# Patient Record
Sex: Male | Born: 1935 | Race: Black or African American | Hispanic: No | Marital: Single | Smoking: Former smoker
Health system: Southern US, Community
[De-identification: ages and names within clinical notes are randomized; demographics above are authoritative.]

## PROBLEM LIST (undated history)

## (undated) DIAGNOSIS — K219 Gastro-esophageal reflux disease without esophagitis: Secondary | ICD-10-CM

## (undated) DIAGNOSIS — I10 Essential (primary) hypertension: Secondary | ICD-10-CM

## (undated) DIAGNOSIS — E119 Type 2 diabetes mellitus without complications: Secondary | ICD-10-CM

## (undated) DIAGNOSIS — M1990 Unspecified osteoarthritis, unspecified site: Secondary | ICD-10-CM

## (undated) DIAGNOSIS — E785 Hyperlipidemia, unspecified: Secondary | ICD-10-CM

## (undated) DIAGNOSIS — I723 Aneurysm of iliac artery: Secondary | ICD-10-CM

## (undated) HISTORY — PX: CHOLECYSTECTOMY: SHX55

## (undated) HISTORY — DX: Aneurysm of iliac artery: I72.3

## (undated) HISTORY — PX: TUMOR EXCISION: SHX421

---

## 2013-03-21 ENCOUNTER — Ambulatory Visit: Payer: Self-pay | Admitting: Podiatry

## 2013-03-24 ENCOUNTER — Ambulatory Visit (INDEPENDENT_AMBULATORY_CARE_PROVIDER_SITE_OTHER): Payer: Medicare Other | Admitting: Podiatry

## 2013-03-24 VITALS — BP 161/86 | Ht 71.0 in | Wt 223.0 lb

## 2013-03-24 DIAGNOSIS — B351 Tinea unguium: Secondary | ICD-10-CM | POA: Insufficient documentation

## 2013-03-24 DIAGNOSIS — M25579 Pain in unspecified ankle and joints of unspecified foot: Secondary | ICD-10-CM

## 2013-03-24 NOTE — Progress Notes (Signed)
Subjective: 79 y.o. year old male patient presents complaining of painful nails. Patient requests toe nails, corns and calluses trimmed. No new problems.   Patient Summary List & History reviewed for allergies, medications, medical problems and surgical history.  Objective: Dermatologic:  Thick dystrophic nails x 10.  Vascular: All pedal pulses palpable. No edema or erythema on foot.   Orthopedic:  Rectus foot without gross deformities.  Neurologic:  All epicritic and tactile sensations grossly intact.  Assessment: Dystrophic mycotic nails x 10.  Treatment: All mycotic nails debrided.  Patient will return as needed.

## 2013-10-27 ENCOUNTER — Emergency Department (HOSPITAL_BASED_OUTPATIENT_CLINIC_OR_DEPARTMENT_OTHER): Payer: Medicare Other

## 2013-10-27 ENCOUNTER — Encounter (HOSPITAL_BASED_OUTPATIENT_CLINIC_OR_DEPARTMENT_OTHER): Payer: Self-pay | Admitting: Emergency Medicine

## 2013-10-27 ENCOUNTER — Emergency Department (HOSPITAL_BASED_OUTPATIENT_CLINIC_OR_DEPARTMENT_OTHER)
Admission: EM | Admit: 2013-10-27 | Discharge: 2013-10-27 | Disposition: A | Discharge status: Home / Self Care | Payer: Medicare Other | Attending: Emergency Medicine | Admitting: Emergency Medicine

## 2013-10-27 DIAGNOSIS — N281 Cyst of kidney, acquired: Secondary | ICD-10-CM | POA: Insufficient documentation

## 2013-10-27 DIAGNOSIS — N400 Enlarged prostate without lower urinary tract symptoms: Secondary | ICD-10-CM | POA: Insufficient documentation

## 2013-10-27 DIAGNOSIS — E785 Hyperlipidemia, unspecified: Secondary | ICD-10-CM | POA: Insufficient documentation

## 2013-10-27 DIAGNOSIS — I723 Aneurysm of iliac artery: Secondary | ICD-10-CM | POA: Insufficient documentation

## 2013-10-27 DIAGNOSIS — Z7982 Long term (current) use of aspirin: Secondary | ICD-10-CM | POA: Insufficient documentation

## 2013-10-27 DIAGNOSIS — Z79899 Other long term (current) drug therapy: Secondary | ICD-10-CM | POA: Insufficient documentation

## 2013-10-27 DIAGNOSIS — R1110 Vomiting, unspecified: Secondary | ICD-10-CM

## 2013-10-27 DIAGNOSIS — I10 Essential (primary) hypertension: Secondary | ICD-10-CM | POA: Insufficient documentation

## 2013-10-27 DIAGNOSIS — M129 Arthropathy, unspecified: Secondary | ICD-10-CM | POA: Insufficient documentation

## 2013-10-27 DIAGNOSIS — E119 Type 2 diabetes mellitus without complications: Secondary | ICD-10-CM | POA: Insufficient documentation

## 2013-10-27 DIAGNOSIS — Z87891 Personal history of nicotine dependence: Secondary | ICD-10-CM | POA: Insufficient documentation

## 2013-10-27 DIAGNOSIS — K219 Gastro-esophageal reflux disease without esophagitis: Secondary | ICD-10-CM | POA: Insufficient documentation

## 2013-10-27 HISTORY — DX: Hyperlipidemia, unspecified: E78.5

## 2013-10-27 HISTORY — DX: Gastro-esophageal reflux disease without esophagitis: K21.9

## 2013-10-27 HISTORY — DX: Type 2 diabetes mellitus without complications: E11.9

## 2013-10-27 HISTORY — DX: Unspecified osteoarthritis, unspecified site: M19.90

## 2013-10-27 HISTORY — DX: Essential (primary) hypertension: I10

## 2013-10-27 LAB — URINALYSIS, ROUTINE W REFLEX MICROSCOPIC
Glucose, UA: NEGATIVE mg/dL
Hgb urine dipstick: NEGATIVE
Protein, ur: 100 mg/dL — AB
Specific Gravity, Urine: 1.021 (ref 1.005–1.030)
Urobilinogen, UA: 2 mg/dL — ABNORMAL HIGH (ref 0.0–1.0)

## 2013-10-27 LAB — COMPREHENSIVE METABOLIC PANEL
AST: 219 U/L — ABNORMAL HIGH (ref 0–37)
CO2: 24 mEq/L (ref 19–32)
Calcium: 10.7 mg/dL — ABNORMAL HIGH (ref 8.4–10.5)
Creatinine, Ser: 0.9 mg/dL (ref 0.50–1.35)
GFR calc Af Amer: >90 mL/min (ref >90)
GFR calc non Af Amer: 79 mL/min — ABNORMAL LOW (ref >90)
Total Protein: 8 g/dL (ref 6.0–8.3)

## 2013-10-27 LAB — CBC WITH DIFFERENTIAL/PLATELET
Basophils Absolute: 0 10*3/uL (ref 0.0–0.1)
Basophils Relative: 0 % (ref 0–1)
Eosinophils Absolute: 0.1 10*3/uL (ref 0.0–0.7)
Eosinophils Relative: 1 % (ref 0–5)
HCT: 44.3 % (ref 39.0–52.0)
Lymphocytes Relative: 13 % (ref 12–46)
MCH: 30.5 pg (ref 26.0–34.0)
MCHC: 34.3 g/dL (ref 30.0–36.0)
MCV: 88.8 fL (ref 78.0–100.0)
Monocytes Absolute: 0.5 10*3/uL (ref 0.1–1.0)
Monocytes Relative: 6 % (ref 3–12)
Platelets: 204 10*3/uL (ref 150–400)
RDW: 14.2 % (ref 11.5–15.5)

## 2013-10-27 LAB — URINE MICROSCOPIC-ADD ON

## 2013-10-27 MED ORDER — ONDANSETRON HCL 4 MG/2ML IJ SOLN
4.0000 mg | Freq: Once | INTRAMUSCULAR | Status: AC
  Administered 2013-10-27: 4 mg via INTRAVENOUS
  Filled 2013-10-27: qty 2

## 2013-10-27 MED ORDER — ONDANSETRON 8 MG PO TBDP: ORAL_TABLET | ORAL | Status: DC

## 2013-10-27 MED ORDER — FENTANYL CITRATE 0.05 MG/ML IJ SOLN
50.0000 ug | Freq: Once | INTRAMUSCULAR | Status: AC
Start: 2013-10-27 — End: 2013-10-27
  Administered 2013-10-27: 50 ug via INTRAVENOUS
  Filled 2013-10-27: qty 2

## 2013-10-27 MED ORDER — DOXYCYCLINE HYCLATE 100 MG PO CAPS: 100.0000 mg | ORAL_CAPSULE | Freq: Two times a day (BID) | ORAL | Status: DC

## 2013-10-27 MED ORDER — IOHEXOL 350 MG/ML SOLN
100.0000 mL | Freq: Once | INTRAVENOUS | Status: AC | PRN
  Administered 2013-10-27: 100 mL via INTRAVENOUS

## 2013-10-27 NOTE — ED Notes (Signed)
Pt encouraged to provide urine specimen.  

## 2013-10-27 NOTE — ED Notes (Signed)
Pt reports  Onset abd pain this morning denies N/V/D last BM this AM was normal

## 2013-10-27 NOTE — ED Provider Notes (Signed)
CSN: [id number]     Arrival date & time 10/27/13  0422 History   First MD Initiated Contact with Patient 10/27/13 0501     Chief Complaint  Patient presents with  . Abdominal Pain   (Consider location/radiation/quality/duration/timing/severity/associated sxs/prior Treatment) Patient is a 79 y.o. male presenting with abdominal pain. The history is provided by the patient.  Abdominal Pain Pain location:  LUQ and RLQ Pain quality: bloating and dull   Pain radiates to:  Does not radiate Pain severity:  Moderate Onset quality:  Sudden Timing:  Constant Progression:  Unchanged Chronicity:  New Context: eating   Relieved by:  Nothing Worsened by:  Nothing tried Ineffective treatments:  None tried Associated symptoms: no anorexia, no chills and no fever   Risk factors: being elderly   Risk factors: no alcohol abuse and has not had multiple surgeries     Past Medical History  Diagnosis Date  . Hypertension   . Diabetes mellitus without complication   . Arthritis   . Hyperlipemia   . GERD (gastroesophageal reflux disease)    History reviewed. No pertinent past surgical history. History reviewed. No pertinent family history. History  Substance Use Topics  . Smoking status: Former Games developer  . Smokeless tobacco: Not on file  . Alcohol Use: No    Review of Systems  Constitutional: Negative for fever and chills.  Gastrointestinal: Positive for abdominal pain. Negative for anorexia.  All other systems reviewed and are negative.    Allergies  Review of patient's allergies indicates no known allergies.  Home Medications   Current Outpatient Rx  Name  Route  Sig  Dispense  Refill  . amLODipine (NORVASC) 5 MG tablet   Oral   Take 5 mg by mouth daily.         Marland Kitchen aspirin 81 MG tablet   Oral   Take 81 mg by mouth daily.         Marland Kitchen esomeprazole (NEXIUM) 20 MG capsule   Oral   Take 20 mg by mouth daily before breakfast.         . ezetimibe (ZETIA) 10 MG tablet    Oral   Take 10 mg by mouth daily.         . magnesium oxide (MAG-OX) 400 MG tablet   Oral   Take 250 mg by mouth daily.         . metFORMIN (GLUCOPHAGE) 500 MG tablet   Oral   Take 500 mg by mouth 2 (two) times daily with a meal.         . metoprolol (LOPRESSOR) 50 MG tablet   Oral   Take 50 mg by mouth 2 (two) times daily.         . pravastatin (PRAVACHOL) 80 MG tablet   Oral   Take 80 mg by mouth daily.          BP 173/95  Pulse 115  Temp(Src) 98 F (36.7 C) (Oral)  Resp 20  Ht 5\' 11"  (1.803 m)  Wt 220 lb (99.791 kg)  BMI 30.70 kg/m2  SpO2 98% Physical Exam  Constitutional: He is oriented to person, place, and time. He appears well-developed and well-nourished. No distress.  HENT:  Head: Normocephalic and atraumatic.  Mouth/Throat: Oropharynx is clear and moist.  Eyes: Conjunctivae are normal. Pupils are equal, round, and reactive to light.  Neck: Normal range of motion. Neck supple.  Cardiovascular: Normal rate and regular rhythm.   Pulmonary/Chest: Effort normal and breath sounds normal.  Abdominal: Soft. Bowel sounds are normal. He exhibits distension. He exhibits no mass. There is no tenderness. There is no rebound and no guarding.  Musculoskeletal: Normal range of motion.  Neurological: He is alert and oriented to person, place, and time.  Skin: Skin is warm and dry.  Psychiatric: He has a normal mood and affect.    ED Course  Procedures (including critical care time) Labs Review Labs Reviewed  CBC WITH DIFFERENTIAL - Abnormal; Notable for the following:    Neutrophils Relative % 80 (*)    All other components within normal limits  COMPREHENSIVE METABOLIC PANEL - Abnormal; Notable for the following:    Glucose, Bld 185 (*)    Calcium 10.7 (*)    AST 219 (*)    ALT 106 (*)    Alkaline Phosphatase 130 (*)    GFR calc non Af Amer 79 (*)    All other components within normal limits  URINALYSIS, ROUTINE W REFLEX MICROSCOPIC - Abnormal; Notable  for the following:    Ketones, ur 15 (*)    Protein, ur 100 (*)    Urobilinogen, UA 2.0 (*)    Leukocytes, UA TRACE (*)    All other components within normal limits  URINE MICROSCOPIC-ADD ON - Abnormal; Notable for the following:    Squamous Epithelial / LPF FEW (*)    All other components within normal limits  LIPASE, BLOOD   Imaging Review No results found.  EKG Interpretation   None       MDM  No diagnosis found. Have gone over all CT results and need for close follow up with urology for the enlarged prostate and PSA, PMD for repeat LFTs and ongoing care and vascular surgery for iliac artery aneurysm.  Patient states he felt better after vomiting thinks he may have eaten something bad.      Jasmine Awe, MD 10/27/13 [phone]

## 2013-10-30 ENCOUNTER — Telehealth: Payer: Self-pay

## 2013-10-30 NOTE — Telephone Encounter (Signed)
Rec'd phone call from pt's daughter.  Stated pt. Needs an appt. With Dr. Imogene Burn for an aneurysm.  Reports that pt. was seen in the ER recently, and was told to make an appt. with Vascular Surgery for further evaluation of aneurysm; no referral has been received from the ER Physician.  Questioned pt's daughter if pt. Was having any symptoms.  Stated "he's not feeling good, and his leg hurts."  Discussed with Dr. Darrick Penna.  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  Per Dr. Darrick Penna: can schedule new patient appt. at next available, since size is still under the parameter to operate on.  Notified pt's daughter of plan to schedule appt., in future, for consult. For Iliac Artery Aneurysm, and that one of the schedulers will contact her with an appt. For pt. Encouraged her to contact pt's PCP to report pt's. current complaints.  Verb. Understanding.

## 2013-11-05 ENCOUNTER — Ambulatory Visit (INDEPENDENT_AMBULATORY_CARE_PROVIDER_SITE_OTHER): Payer: Medicare Other | Admitting: Podiatry

## 2013-11-05 ENCOUNTER — Encounter: Payer: Self-pay | Admitting: Podiatry

## 2013-11-05 VITALS — BP 190/98 | HR 92

## 2013-11-05 DIAGNOSIS — B351 Tinea unguium: Secondary | ICD-10-CM

## 2013-11-05 DIAGNOSIS — M25579 Pain in unspecified ankle and joints of unspecified foot: Secondary | ICD-10-CM

## 2013-11-05 NOTE — Patient Instructions (Signed)
Seen for hypertrophic nails. All nails debrided. Return in 3 months or as needed.  

## 2013-11-05 NOTE — Progress Notes (Signed)
Subjective:  79 y.o. year old male patient presents complaining of painful nails. Patient requests toe nails trimmed.  No new problems.   Objective: Dermatologic:  Thick dystrophic nails x 10.  Vascular:  All pedal pulses palpable.  No edema or erythema on foot.  Orthopedic:  Rectus foot without gross deformities.  Neurologic:  All epicritic and tactile sensations grossly intact.   Assessment:  Dystrophic mycotic nails x 10.   Treatment: All mycotic nails debrided.  Patient will return as needed.

## 2013-11-27 ENCOUNTER — Encounter: Payer: Self-pay | Admitting: Vascular Surgery

## 2013-11-28 ENCOUNTER — Ambulatory Visit (INDEPENDENT_AMBULATORY_CARE_PROVIDER_SITE_OTHER): Payer: Medicare Other | Admitting: Vascular Surgery

## 2013-11-28 ENCOUNTER — Encounter: Payer: Self-pay | Admitting: Vascular Surgery

## 2013-11-28 VITALS — BP 174/93 | HR 103 | Resp 18 | Ht 71.0 in | Wt 221.0 lb

## 2013-11-28 DIAGNOSIS — I723 Aneurysm of iliac artery: Secondary | ICD-10-CM | POA: Insufficient documentation

## 2013-11-28 NOTE — Addendum Note (Signed)
Addended by: Sharee PimpleMCCHESNEY, Josiyah Tozzi K on: 11/28/2013 05:08 PM   Modules accepted: Orders

## 2013-11-28 NOTE — Progress Notes (Signed)
Referred by: Chauncy LeanPatrick Watterson, PA-C [address] [place], [place] [id number]  Reason for referral: right common iliac artery aneurysm  History of Present Illness  The patient is a 80 y.o. (12/23/1931) male who presents with chief complaint: "not certain why I'm here".  Patient recently (Dec 2014) had abdominal pain with nausea and vomitting and went to ER.  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  The patient denies any intermittent claudication or rest pain.  He has no history c/w embolic disease.  The patient's risk factors for aneurysmal disease included: HTN, Hyperlipidemia, age, and prior smoking history.  The patient previously smoked cigarettes.  Past Medical History  Diagnosis Date  . Hypertension   . Diabetes mellitus without complication   . Arthritis   . Hyperlipemia   . GERD (gastroesophageal reflux disease)   . Aneurysm artery, iliac     Past Surgical History  Procedure Laterality Date  . Tumor excision Right     lower right abdominal    History   Social History  . Marital Status: Single    Spouse Name: N/A    Number of Children: N/A  . Years of Education: N/A   Occupational History  . Not on file.   Social History Main Topics  . Smoking status: Former Smoker    Types: Cigarettes    Quit date: 11/29/2011  . Smokeless tobacco: Former NeurosurgeonUser    Quit date: 11/28/1953  . Alcohol Use: No  . Drug Use: No  . Sexual Activity: Not on file   Other Topics Concern  . Not on file   Social History Narrative  . No narrative on file    Family History  Problem Relation Age of Onset  . Heart disease Mother   . Heart disease Father     Current Outpatient Prescriptions on File Prior to Visit  Medication Sig Dispense Refill  . amLODipine (NORVASC) 5 MG tablet Take 5 mg by mouth daily.      Marland Kitchen. aspirin 81 MG tablet Take 81 mg by mouth daily.      Marland Kitchen. esomeprazole (NEXIUM) 20 MG capsule Take 20 mg by mouth daily before  breakfast.      . ezetimibe (ZETIA) 10 MG tablet Take 10 mg by mouth daily.      . magnesium oxide (MAG-OX) 400 MG tablet Take 250 mg by mouth daily.      . metFORMIN (GLUCOPHAGE) 500 MG tablet Take 500 mg by mouth 2 (two) times daily with a meal.      . metoprolol (LOPRESSOR) 50 MG tablet Take 50 mg by mouth 2 (two) times daily.      Marland Kitchen. doxycycline (VIBRAMYCIN) 100 MG capsule Take 1 capsule (100 mg total) by mouth 2 (two) times daily.  20 capsule  0  . ondansetron (ZOFRAN ODT) 8 MG disintegrating tablet 8mg  ODT q4 hours prn nausea  4 tablet  0  . pravastatin (PRAVACHOL) 80 MG tablet Take 80 mg by mouth daily.       No current facility-administered medications on file prior to visit.    No Known Allergies    REVIEW OF SYSTEMS:  (Positives checked otherwise negative)  CARDIOVASCULAR:  []  chest pain, []  chest pressure, []  palpitations, []  shortness of breath when laying flat, []  shortness of breath with exertion,  [x]  pain in feet when walking, [x]  pain in feet when laying flat, [x]  history of blood clot in veins (DVT), []  history  of phlebitis, []  swelling in legs, []  varicose veins  PULMONARY:  []  productive cough, []  asthma, []  wheezing  NEUROLOGIC:  []  weakness in arms or legs, []  numbness in arms or legs, []  difficulty speaking or slurred speech, []  temporary loss of vision in one eye, []  dizziness  HEMATOLOGIC:  []  bleeding problems, []  problems with blood clotting too easily  MUSCULOSKEL:  []  joint pain, []  joint swelling  GASTROINTEST:  []  vomiting blood, []  blood in stool     GENITOURINARY:  []  burning with urination, []  blood in urine  PSYCHIATRIC:  []  history of major depression  INTEGUMENTARY:  []  rashes, []  ulcers  CONSTITUTIONAL:  []  fever, []  chills  Physical Examination  Filed Vitals:   11/28/13 0913  BP: 174/93  Pulse: 103  Resp: 18  Height: 5\' 11"  (1.803 m)  Weight: 221 lb (100.245 kg)   Body mass index is 30.84 kg/(m^2).  General: A&O x 3,  WDWN  Head: [place]/AT  Ear/Nose/Throat: Hearing grossly intact, nares w/o erythema or drainage, oropharynx w/o Erythema/Exudate  Eyes: PERRLA, EOMI  Neck: Supple, no nuchal rigidity, no palpable LAD  Pulmonary: Sym exp, good air movt, CTAB, no rales, rhonchi, & wheezing  Cardiac: RRR, Nl S1, S2, no Murmurs, rubs or gallops  Vascular: Vessel Right Left  Radial Palpable Palpable  Brachial Palpable Palpable  Carotid Palpable, without bruit Palpable, without bruit  Aorta Not palpable N/A  Femoral Palpable Palpable  Popliteal Not palpable Not palpable  PT Palpable Palpable  DP Faintly Palpable Faintly Palpable   Gastrointestinal: soft, NTND, -G/R, - HSM, - masses, - CVAT B  Musculoskeletal: M/S 5/5 throughout , Extremities without ischemic changes , no palpable pop aneurysm, no splinter hemorrhages in nail bed B  Neurologic: CN 2-12 intact , Pain and light touch intact in extremities , Motor exam as listed above  Psychiatric: Judgment intact, Mood & affect appropriate for pt's clinical situation  Dermatologic: See M/S exam for extremity exam, no rashes otherwise noted,  Lymph : No Cervical, Axillary, or Inguinal lymphadenopathy   CTA Abd/pelvis (10/27/13)  No abdominal aortic aneurysm. 17 mm right common iliac artery aneurysm with moderate to severe abdominal aorta atherosclerosis.   Multiple renal and hepatic cysts on this angiographic phase.   Prostatomegaly, consider correlation with PSA as clinically indicated.  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  Medical Decision Making  The patient is a 80 y.o. male who presents with: asx R CIA aneurysm   Based on this patient's exam and diagnostic studies, he needs annual aortoiliac duplex to keep an eye on R CIA aneurysm.  The threshold for repair is size > 3.5 cm or sx status:  embolization or compressive sx  The patient will follow up in 1 year with this study.    I emphasized the importance of maximal medical management including strict control of blood pressure, blood glucose, and lipid levels, antiplatelet agents, obtaining regular exercise, and cessation of smoking.    Thank you for allowing [place] to participate in this patient's care.  Leonides Sake, MD [facility] Office: [phone] Pager: [phone]  11/28/2013, 9:34 AM

## 2014-05-19 ENCOUNTER — Ambulatory Visit (INDEPENDENT_AMBULATORY_CARE_PROVIDER_SITE_OTHER): Payer: Medicare Other | Admitting: Podiatry

## 2014-05-19 ENCOUNTER — Encounter: Payer: Self-pay | Admitting: Podiatry

## 2014-05-19 VITALS — BP 166/83 | HR 73 | Ht 71.0 in | Wt 226.0 lb

## 2014-05-19 DIAGNOSIS — B351 Tinea unguium: Secondary | ICD-10-CM

## 2014-05-19 DIAGNOSIS — M79606 Pain in leg, unspecified: Secondary | ICD-10-CM | POA: Insufficient documentation

## 2014-05-19 DIAGNOSIS — M79609 Pain in unspecified limb: Secondary | ICD-10-CM

## 2014-05-19 NOTE — Progress Notes (Signed)
Subjective:  80 y.o. year old male patient presents complaining of painful nails. Patient requests toe nails trimmed.  No new problems.   Objective: Dermatologic:  Thick dystrophic nails x 10.  Vascular:  All pedal pulses palpable.  No edema or erythema on foot.  Orthopedic:  Rectus foot without gross deformities.  Neurologic:  All epicritic and tactile sensations grossly intact.   Assessment:  Dystrophic mycotic nails x 10.  Painful nails both feet.   Treatment: All mycotic nails debrided.  Patient will return as needed

## 2014-05-19 NOTE — Patient Instructions (Signed)
Seen for hypertrophic nails. All nails debrided. Return in 3 months or as needed.  

## 2014-10-02 ENCOUNTER — Emergency Department (HOSPITAL_BASED_OUTPATIENT_CLINIC_OR_DEPARTMENT_OTHER)
Admission: EM | Admit: 2014-10-02 | Discharge: 2014-10-03 | Disposition: A | Discharge status: Other Acute Inpatient Hospital | Payer: Medicare Other | Attending: Emergency Medicine | Admitting: Emergency Medicine

## 2014-10-02 ENCOUNTER — Emergency Department (HOSPITAL_BASED_OUTPATIENT_CLINIC_OR_DEPARTMENT_OTHER): Payer: Medicare Other

## 2014-10-02 DIAGNOSIS — Z87891 Personal history of nicotine dependence: Secondary | ICD-10-CM | POA: Insufficient documentation

## 2014-10-02 DIAGNOSIS — E119 Type 2 diabetes mellitus without complications: Secondary | ICD-10-CM | POA: Insufficient documentation

## 2014-10-02 DIAGNOSIS — R197 Diarrhea, unspecified: Secondary | ICD-10-CM | POA: Diagnosis not present

## 2014-10-02 DIAGNOSIS — M1990 Unspecified osteoarthritis, unspecified site: Secondary | ICD-10-CM | POA: Diagnosis not present

## 2014-10-02 DIAGNOSIS — K219 Gastro-esophageal reflux disease without esophagitis: Secondary | ICD-10-CM | POA: Diagnosis not present

## 2014-10-02 DIAGNOSIS — Z792 Long term (current) use of antibiotics: Secondary | ICD-10-CM | POA: Insufficient documentation

## 2014-10-02 DIAGNOSIS — Z8679 Personal history of other diseases of the circulatory system: Secondary | ICD-10-CM | POA: Insufficient documentation

## 2014-10-02 DIAGNOSIS — E785 Hyperlipidemia, unspecified: Secondary | ICD-10-CM | POA: Insufficient documentation

## 2014-10-02 DIAGNOSIS — Z79899 Other long term (current) drug therapy: Secondary | ICD-10-CM | POA: Insufficient documentation

## 2014-10-02 DIAGNOSIS — I10 Essential (primary) hypertension: Secondary | ICD-10-CM | POA: Diagnosis not present

## 2014-10-02 DIAGNOSIS — K8500 Idiopathic acute pancreatitis without necrosis or infection: Secondary | ICD-10-CM

## 2014-10-02 DIAGNOSIS — B199 Unspecified viral hepatitis without hepatic coma: Secondary | ICD-10-CM | POA: Diagnosis not present

## 2014-10-02 DIAGNOSIS — R109 Unspecified abdominal pain: Secondary | ICD-10-CM | POA: Diagnosis present

## 2014-10-02 DIAGNOSIS — R112 Nausea with vomiting, unspecified: Secondary | ICD-10-CM | POA: Insufficient documentation

## 2014-10-02 DIAGNOSIS — K759 Inflammatory liver disease, unspecified: Secondary | ICD-10-CM

## 2014-10-02 DIAGNOSIS — R52 Pain, unspecified: Secondary | ICD-10-CM

## 2014-10-02 DIAGNOSIS — R1110 Vomiting, unspecified: Secondary | ICD-10-CM

## 2014-10-02 LAB — COMPREHENSIVE METABOLIC PANEL
ALT: 263 U/L — AB (ref 0–53)
ANION GAP: 16 — AB (ref 5–15)
AST: 487 U/L — ABNORMAL HIGH (ref 0–37)
Albumin: 3.5 g/dL (ref 3.5–5.2)
Alkaline Phosphatase: 140 U/L — ABNORMAL HIGH (ref 39–117)
BILIRUBIN TOTAL: 2.5 mg/dL — AB (ref 0.3–1.2)
BUN: 13 mg/dL (ref 6–23)
CHLORIDE: 102 meq/L (ref 96–112)
CO2: 21 meq/L (ref 19–32)
Calcium: 10 mg/dL (ref 8.4–10.5)
Creatinine, Ser: 1 mg/dL (ref 0.50–1.35)
GFR calc non Af Amer: 69 mL/min — ABNORMAL LOW (ref >90)
GFR, EST AFRICAN AMERICAN: 80 mL/min — AB (ref >90)
GLUCOSE: 148 mg/dL — AB (ref 70–99)
POTASSIUM: 3.6 meq/L — AB (ref 3.7–5.3)
SODIUM: 139 meq/L (ref 137–147)
Total Protein: 7.2 g/dL (ref 6.0–8.3)

## 2014-10-02 LAB — URINALYSIS, ROUTINE W REFLEX MICROSCOPIC
BILIRUBIN URINE: NEGATIVE
GLUCOSE, UA: NEGATIVE mg/dL
HGB URINE DIPSTICK: NEGATIVE
Ketones, ur: NEGATIVE mg/dL
Leukocytes, UA: NEGATIVE
Nitrite: NEGATIVE
PROTEIN: NEGATIVE mg/dL
Specific Gravity, Urine: 1.014 (ref 1.005–1.030)
UROBILINOGEN UA: 1 mg/dL (ref 0.0–1.0)
pH: 5 (ref 5.0–8.0)

## 2014-10-02 LAB — CBC WITH DIFFERENTIAL/PLATELET
Basophils Absolute: 0 10*3/uL (ref 0.0–0.1)
Basophils Relative: 0 % (ref 0–1)
Eosinophils Absolute: 0 10*3/uL (ref 0.0–0.7)
Eosinophils Relative: 0 % (ref 0–5)
HCT: 43.5 % (ref 39.0–52.0)
Hemoglobin: 15.3 g/dL (ref 13.0–17.0)
LYMPHS ABS: 0.3 10*3/uL — AB (ref 0.7–4.0)
LYMPHS PCT: 5 % — AB (ref 12–46)
MCH: 31 pg (ref 26.0–34.0)
MCHC: 35.2 g/dL (ref 30.0–36.0)
MCV: 88.1 fL (ref 78.0–100.0)
MONOS PCT: 2 % — AB (ref 3–12)
Monocytes Absolute: 0.1 10*3/uL (ref 0.1–1.0)
NEUTROS ABS: 5.8 10*3/uL (ref 1.7–7.7)
NEUTROS PCT: 93 % — AB (ref 43–77)
PLATELETS: 141 10*3/uL — AB (ref 150–400)
RBC: 4.94 MIL/uL (ref 4.22–5.81)
RDW: 13.7 % (ref 11.5–15.5)
WBC: 6.2 10*3/uL (ref 4.0–10.5)

## 2014-10-02 LAB — LIPASE, BLOOD: Lipase: 419 U/L — ABNORMAL HIGH (ref 11–59)

## 2014-10-02 MED ORDER — ONDANSETRON HCL 4 MG/2ML IJ SOLN
4.0000 mg | Freq: Once | INTRAMUSCULAR | Status: AC
  Administered 2014-10-02: 4 mg via INTRAVENOUS
  Filled 2014-10-02: qty 2

## 2014-10-02 NOTE — ED Notes (Signed)
Pt. Short of breath with noted abd. Pain and Pt. Tends to lean forward in triage to get his breath.  Pt. Daughter reports he could hardly get his clothes on.  Pt. Daughter reports the Pt. Is independent normally.

## 2014-10-02 NOTE — ED Provider Notes (Signed)
CSN: [id number]     Arrival date & time 10/02/14  2026 History  This chart was scribed for Brandy Kabat Smitty Cords, MD by Elveria Rising, ED scribe.  This patient was seen in room MH01/MH01 and the patient's care was started at 11:51 PM.   Chief Complaint  Patient presents with  . Abdominal Pain   Patient is a 80 y.o. male presenting with abdominal pain. The history is provided by the patient. No language interpreter was used.  Abdominal Pain Pain location:  Generalized Pain quality: cramping and sharp   Pain radiates to:  Does not radiate Pain severity:  Severe Onset quality:  Sudden Timing:  Constant Progression:  Unchanged Chronicity:  New Context: not alcohol use and not sick contacts   Relieved by:  Nothing Worsened by:  Nothing tried Associated symptoms: diarrhea, nausea and vomiting   Associated symptoms: no chills, no cough, no dysuria, no fever and no shortness of breath   Risk factors: no recent hospitalization    HPI Comments: Mahlik Lenn is a 80 y.o. male with PMHx of GERD, Hyperlipidemia, Diabetes who presents to the Emergency Department complaining of severe generalized abdominal worst in lower abdominal pain onset this evening, six hours ago, while watching TV. Family reports diarrhea and vomiting. Family denies recent sick contacts at home, change in medications, or recent abdominal surgies. Patient denies urinary symptoms.   Past Medical History  Diagnosis Date  . Hypertension   . Diabetes mellitus without complication   . Arthritis   . Hyperlipemia   . GERD (gastroesophageal reflux disease)   . Aneurysm artery, iliac    Past Surgical History  Procedure Laterality Date  . Tumor excision Right     lower right abdominal   Family History  Problem Relation Age of Onset  . Heart disease Mother   . Heart disease Father    History  Substance Use Topics  . Smoking status: Former Smoker    Types: Cigarettes    Quit date: 11/29/2011  . Smokeless tobacco:  Former Neurosurgeon    Quit date: 11/28/1953  . Alcohol Use: No    Review of Systems  Constitutional: Negative for fever and chills.  Respiratory: Negative for cough and shortness of breath.   Gastrointestinal: Positive for nausea, vomiting, abdominal pain and diarrhea.  Genitourinary: Negative for dysuria.  All other systems reviewed and are negative.     Allergies  Review of patient's allergies indicates no known allergies.  Home Medications   Prior to Admission medications   Medication Sig Start Date End Date Taking? Authorizing Provider  rosuvastatin (CRESTOR) 20 MG tablet Take 20 mg by mouth daily.   Yes Historical Provider, MD  amLODipine (NORVASC) 5 MG tablet Take 5 mg by mouth daily.    Historical Provider, MD  aspirin 81 MG tablet Take 81 mg by mouth daily.    Historical Provider, MD  doxycycline (VIBRAMYCIN) 100 MG capsule Take 1 capsule (100 mg total) by mouth 2 (two) times daily. 10/27/13   Kalieb Freeland K Caldonia Leap-Rasch, MD  esomeprazole (NEXIUM) 20 MG capsule Take 20 mg by mouth daily before breakfast.    Historical Provider, MD  ezetimibe (ZETIA) 10 MG tablet Take 10 mg by mouth daily.    Historical Provider, MD  magnesium oxide (MAG-OX) 400 MG tablet Take 250 mg by mouth daily.    Historical Provider, MD  metoprolol (LOPRESSOR) 50 MG tablet Take 50 mg by mouth 2 (two) times daily.    Historical Provider, MD  ondansetron (ZOFRAN ODT)  8 MG disintegrating tablet 8mg  ODT q4 hours prn nausea 10/27/13   Abigayl Hor K Laban Orourke-Rasch, MD  pravastatin (PRAVACHOL) 80 MG tablet Take 80 mg by mouth daily.    Historical Provider, MD   Triage Vitals: BP 133/68 mmHg  Pulse 79  Temp(Src) 98.8 F (37.1 C)  Resp 22  Wt 226 lb (102.513 kg)  SpO2 94% Physical Exam  Constitutional: He is oriented to person, place, and time. He appears well-developed and well-nourished. No distress.  HENT:  Head: Normocephalic and atraumatic.  Mouth/Throat: Oropharynx is clear and moist. No oropharyngeal exudate.   Eyes: EOM are normal. Pupils are equal, round, and reactive to light.  Neck: Neck supple. No tracheal deviation present.  Cardiovascular: Normal rate and regular rhythm.   Pulmonary/Chest: Effort normal and breath sounds normal. No respiratory distress. He has no wheezes. He has no rales. He exhibits no tenderness.  Abdominal: Soft. Bowel sounds are normal. There is no tenderness. There is no rebound and no guarding.  Musculoskeletal: Normal range of motion.  DTRs intact.   Neurological: He is alert and oriented to person, place, and time.  Skin: Skin is warm and dry.  Psychiatric: He has a normal mood and affect. His behavior is normal.  Nursing note and vitals reviewed.   ED Course  Procedures (including critical care time)  COORDINATION OF CARE: 11:51 PM- Discussed treatment plan with patient at bedside and patient agreed to plan.   Labs Review Labs Reviewed  CBC WITH DIFFERENTIAL - Abnormal; Notable for the following:    Platelets 141 (*)    Neutrophils Relative % 93 (*)    Lymphocytes Relative 5 (*)    Lymphs Abs 0.3 (*)    Monocytes Relative 2 (*)    All other components within normal limits  COMPREHENSIVE METABOLIC PANEL - Abnormal; Notable for the following:    Potassium 3.6 (*)    Glucose, Bld 148 (*)    AST 487 (*)    ALT 263 (*)    Alkaline Phosphatase 140 (*)    Total Bilirubin 2.5 (*)    GFR calc non Af Amer 69 (*)    GFR calc Af Amer 80 (*)    Anion gap 16 (*)    All other components within normal limits  LIPASE, BLOOD - Abnormal; Notable for the following:    Lipase 419 (*)    All other components within normal limits  URINALYSIS, ROUTINE W REFLEX MICROSCOPIC    Imaging Review No results found.   EKG Interpretation None      MDM   Final diagnoses:  None    Results for orders placed or performed during the hospital encounter of 10/02/14  CBC with Differential  Result Value Ref Range   WBC 6.2 4.0 - 10.5 K/uL   RBC 4.94 4.22 - 5.81 MIL/uL    Hemoglobin 15.3 13.0 - 17.0 g/dL   HCT 78.243.5 95.639.0 - 21.352.0 %   MCV 88.1 78.0 - 100.0 fL   MCH 31.0 26.0 - 34.0 pg   MCHC 35.2 30.0 - 36.0 g/dL   RDW 08.613.7 57.811.5 - 46.915.5 %   Platelets 141 (L) 150 - 400 K/uL   Neutrophils Relative % 93 (H) 43 - 77 %   Neutro Abs 5.8 1.7 - 7.7 K/uL   Lymphocytes Relative 5 (L) 12 - 46 %   Lymphs Abs 0.3 (L) 0.7 - 4.0 K/uL   Monocytes Relative 2 (L) 3 - 12 %   Monocytes Absolute 0.1 0.1 -  1.0 K/uL   Eosinophils Relative 0 0 - 5 %   Eosinophils Absolute 0.0 0.0 - 0.7 K/uL   Basophils Relative 0 0 - 1 %   Basophils Absolute 0.0 0.0 - 0.1 K/uL  Comprehensive metabolic panel  Result Value Ref Range   Sodium 139 137 - 147 mEq/L   Potassium 3.6 (L) 3.7 - 5.3 mEq/L   Chloride 102 96 - 112 mEq/L   CO2 21 19 - 32 mEq/L   Glucose, Bld 148 (H) 70 - 99 mg/dL   BUN 13 6 - 23 mg/dL   Creatinine, Ser 1.61 0.50 - 1.35 mg/dL   Calcium 09.6 8.4 - 04.5 mg/dL   Total Protein 7.2 6.0 - 8.3 g/dL   Albumin 3.5 3.5 - 5.2 g/dL   AST 409 (H) 0 - 37 U/L   ALT 263 (H) 0 - 53 U/L   Alkaline Phosphatase 140 (H) 39 - 117 U/L   Total Bilirubin 2.5 (H) 0.3 - 1.2 mg/dL   GFR calc non Af Amer 69 (L) >90 mL/min   GFR calc Af Amer 80 (L) >90 mL/min   Anion gap 16 (H) 5 - 15  Lipase, blood  Result Value Ref Range   Lipase 419 (H) 11 - 59 U/L  Urinalysis, Routine w reflex microscopic  Result Value Ref Range   Color, Urine YELLOW YELLOW   APPearance CLEAR CLEAR   Specific Gravity, Urine 1.014 1.005 - 1.030   pH 5.0 5.0 - 8.0   Glucose, UA NEGATIVE NEGATIVE mg/dL   Hgb urine dipstick NEGATIVE NEGATIVE   Bilirubin Urine NEGATIVE NEGATIVE   Ketones, ur NEGATIVE NEGATIVE mg/dL   Protein, ur NEGATIVE NEGATIVE mg/dL   Urobilinogen, UA 1.0 0.0 - 1.0 mg/dL   Nitrite NEGATIVE NEGATIVE   Leukocytes, UA NEGATIVE NEGATIVE   US Abdomen Complete  10/03/2014   CLINICAL DATA:  Elevated liver function studies and lipase levels. Epigastric pain with nausea and vomiting for 1 day. History of  hypertension and diabetes. Initial encounter.  EXAM: ULTRASOUND ABDOMEN COMPLETE  COMPARISON:  Abdominal pelvic CT 10/27/2013.  FINDINGS: Gallbladder: Incompletely distended. There is echogenic mobile debris within the gallbladder lumen and mild wall thickening to 3.6 mm. No pericholecystic fluid, discrete gallstones or sonographic Murphy sign demonstrated.  Common bile duct: Diameter: 2.2 mm  Liver: The hepatic echogenicity is diffusely increased. There are multiple lobulated cyst within the left hepatic lobe as demonstrated on prior CT. The largest measures 5.7 x 4.6 x 6.6 cm.  IVC: No abnormality visualized. Portions of the IVC are obscured by bowel gas.  Pancreas: Increased echogenicity consistent with fatty replacement. Portions are obscured by bowel gas. No focal abnormality or surrounding inflammatory change.  Spleen: Size and appearance within normal limits.  Right Kidney: Length: 14.2 cm. There are multiple cystic lesions, the largest in the upper pole measuring up to 5.2 cm. No hydronephrosis.  Left Kidney: Length: 11.7 cm. There are several cystic lesions, the largest in the interpolar region measuring up to 2.9 cm. No hydronephrosis.  Abdominal aorta: Atherosclerosis without demonstrated aneurysm. Portions of the aorta are obscured by bowel gas.  Other findings: None.  IMPRESSION: 1. Mild nonspecific gallbladder wall thickening associated with echogenic debris in the gallbladder lumen, but no sonographic Murphy sign or biliary dilatation. 2. Grossly stable hepatic and bilateral renal cysts. No hydronephrosis. 3. No signs of complicated pancreatitis. Portions of the pancreas are obscured by bowel gas.   Electronically Signed   By: Sandi Mariscal.D.  On: 10/03/2014 00:53    Medications  fentaNYL (SUBLIMAZE) injection 100 mcg (100 mcg Intravenous Not Given 10/03/14 0208)  ondansetron [facility](ZOFRAN) injection 4 mg (4 mg Intravenous Given 10/02/14 2300)  sodium chloride 0.9 % bolus 1,000 mL (1,000 mLs  Intravenous Transfusing/Transfer 10/03/14 0208)   Will admit for pancreatitis, [facility]  Dr. Heron NayVasireddy agrees to accept patient to med surgery bed  I personally performed the services described in this documentation, which was scribed in my presence. The recorded information has been reviewed and is accurate.    Delonte Musich Smitty CordsK Josseline Reddin-Rasch, MD 10/03/14 0300

## 2014-10-03 ENCOUNTER — Encounter (HOSPITAL_BASED_OUTPATIENT_CLINIC_OR_DEPARTMENT_OTHER): Payer: Self-pay | Admitting: Emergency Medicine

## 2014-10-03 MED ORDER — SODIUM CHLORIDE 0.9 % IV BOLUS (SEPSIS)
1000.0000 mL | Freq: Once | INTRAVENOUS | Status: AC
  Administered 2014-10-03: 1000 mL via INTRAVENOUS

## 2014-10-03 MED ORDER — FENTANYL CITRATE 0.05 MG/ML IJ SOLN: 100.0000 ug | Freq: Once | INTRAMUSCULAR | Status: DC

## 2014-10-03 NOTE — ED Notes (Signed)
[facility]

## 2014-10-23 ENCOUNTER — Encounter (HOSPITAL_BASED_OUTPATIENT_CLINIC_OR_DEPARTMENT_OTHER): Payer: Self-pay

## 2014-10-23 ENCOUNTER — Emergency Department (HOSPITAL_BASED_OUTPATIENT_CLINIC_OR_DEPARTMENT_OTHER): Payer: Medicare Other

## 2014-10-23 ENCOUNTER — Emergency Department (HOSPITAL_BASED_OUTPATIENT_CLINIC_OR_DEPARTMENT_OTHER)
Admission: EM | Admit: 2014-10-23 | Discharge: 2014-10-24 | Disposition: A | Discharge status: Other Acute Inpatient Hospital | Payer: Medicare Other | Attending: Emergency Medicine | Admitting: Emergency Medicine

## 2014-10-23 DIAGNOSIS — M1990 Unspecified osteoarthritis, unspecified site: Secondary | ICD-10-CM | POA: Insufficient documentation

## 2014-10-23 DIAGNOSIS — Z79899 Other long term (current) drug therapy: Secondary | ICD-10-CM | POA: Insufficient documentation

## 2014-10-23 DIAGNOSIS — R630 Anorexia: Secondary | ICD-10-CM | POA: Insufficient documentation

## 2014-10-23 DIAGNOSIS — R109 Unspecified abdominal pain: Secondary | ICD-10-CM

## 2014-10-23 DIAGNOSIS — R05 Cough: Secondary | ICD-10-CM | POA: Diagnosis present

## 2014-10-23 DIAGNOSIS — R059 Cough, unspecified: Secondary | ICD-10-CM

## 2014-10-23 DIAGNOSIS — Z87891 Personal history of nicotine dependence: Secondary | ICD-10-CM | POA: Insufficient documentation

## 2014-10-23 DIAGNOSIS — E782 Mixed hyperlipidemia: Secondary | ICD-10-CM | POA: Insufficient documentation

## 2014-10-23 DIAGNOSIS — R000 Tachycardia, unspecified: Secondary | ICD-10-CM | POA: Diagnosis not present

## 2014-10-23 DIAGNOSIS — K219 Gastro-esophageal reflux disease without esophagitis: Secondary | ICD-10-CM | POA: Diagnosis not present

## 2014-10-23 DIAGNOSIS — Z7982 Long term (current) use of aspirin: Secondary | ICD-10-CM | POA: Insufficient documentation

## 2014-10-23 DIAGNOSIS — N390 Urinary tract infection, site not specified: Secondary | ICD-10-CM | POA: Diagnosis not present

## 2014-10-23 DIAGNOSIS — R1011 Right upper quadrant pain: Secondary | ICD-10-CM | POA: Insufficient documentation

## 2014-10-23 DIAGNOSIS — E119 Type 2 diabetes mellitus without complications: Secondary | ICD-10-CM | POA: Insufficient documentation

## 2014-10-23 DIAGNOSIS — I10 Essential (primary) hypertension: Secondary | ICD-10-CM | POA: Insufficient documentation

## 2014-10-23 DIAGNOSIS — A419 Sepsis, unspecified organism: Secondary | ICD-10-CM | POA: Diagnosis not present

## 2014-10-23 LAB — COMPREHENSIVE METABOLIC PANEL
ALT: 15 U/L (ref 0–53)
ANION GAP: 18 — AB (ref 5–15)
AST: 25 U/L (ref 0–37)
Albumin: 3 g/dL — ABNORMAL LOW (ref 3.5–5.2)
Alkaline Phosphatase: 114 U/L (ref 39–117)
BUN: 10 mg/dL (ref 6–23)
CALCIUM: 10.1 mg/dL (ref 8.4–10.5)
CO2: 22 meq/L (ref 19–32)
CREATININE: 1.2 mg/dL (ref 0.50–1.35)
Chloride: 96 mEq/L (ref 96–112)
GFR calc Af Amer: 64 mL/min — ABNORMAL LOW (ref >90)
GFR, EST NON AFRICAN AMERICAN: 55 mL/min — AB (ref >90)
Glucose, Bld: 149 mg/dL — ABNORMAL HIGH (ref 70–99)
Potassium: 4.5 mEq/L (ref 3.7–5.3)
Sodium: 136 mEq/L — ABNORMAL LOW (ref 137–147)
TOTAL PROTEIN: 8.3 g/dL (ref 6.0–8.3)
Total Bilirubin: 1.7 mg/dL — ABNORMAL HIGH (ref 0.3–1.2)

## 2014-10-23 LAB — CBC WITH DIFFERENTIAL/PLATELET
Basophils Absolute: 0 10*3/uL (ref 0.0–0.1)
Basophils Relative: 0 % (ref 0–1)
EOS ABS: 0 10*3/uL (ref 0.0–0.7)
Eosinophils Relative: 0 % (ref 0–5)
HEMATOCRIT: 39.6 % (ref 39.0–52.0)
Hemoglobin: 13.4 g/dL (ref 13.0–17.0)
LYMPHS ABS: 1.3 10*3/uL (ref 0.7–4.0)
LYMPHS PCT: 9 % — AB (ref 12–46)
MCH: 30.5 pg (ref 26.0–34.0)
MCHC: 33.8 g/dL (ref 30.0–36.0)
MCV: 90.2 fL (ref 78.0–100.0)
MONO ABS: 1.3 10*3/uL — AB (ref 0.1–1.0)
Monocytes Relative: 9 % (ref 3–12)
Neutro Abs: 11.5 10*3/uL — ABNORMAL HIGH (ref 1.7–7.7)
Neutrophils Relative %: 82 % — ABNORMAL HIGH (ref 43–77)
Platelets: 375 10*3/uL (ref 150–400)
RBC: 4.39 MIL/uL (ref 4.22–5.81)
RDW: 15 % (ref 11.5–15.5)
WBC: 14.1 10*3/uL — AB (ref 4.0–10.5)

## 2014-10-23 LAB — URINALYSIS, ROUTINE W REFLEX MICROSCOPIC
Glucose, UA: NEGATIVE mg/dL
KETONES UR: 15 mg/dL — AB
NITRITE: NEGATIVE
PROTEIN: 100 mg/dL — AB
Specific Gravity, Urine: 1.022 (ref 1.005–1.030)
Urobilinogen, UA: 1 mg/dL (ref 0.0–1.0)
pH: 5 (ref 5.0–8.0)

## 2014-10-23 LAB — URINE MICROSCOPIC-ADD ON

## 2014-10-23 LAB — TROPONIN I: Troponin I: <0.3 ng/mL (ref <0.30)

## 2014-10-23 LAB — OCCULT BLOOD X 1 CARD TO LAB, STOOL: Fecal Occult Bld: NEGATIVE

## 2014-10-23 LAB — LIPASE, BLOOD: Lipase: 10 U/L — ABNORMAL LOW (ref 11–59)

## 2014-10-23 MED ORDER — SODIUM CHLORIDE 0.9 % IV BOLUS (SEPSIS)
1000.0000 mL | Freq: Once | INTRAVENOUS | Status: AC
  Administered 2014-10-23: 1000 mL via INTRAVENOUS

## 2014-10-23 MED ORDER — IOHEXOL 300 MG/ML  SOLN
100.0000 mL | Freq: Once | INTRAMUSCULAR | Status: AC | PRN
  Administered 2014-10-23: 100 mL via INTRAVENOUS

## 2014-10-23 MED ORDER — SODIUM CHLORIDE 0.9 % IV SOLN: Freq: Once | INTRAVENOUS | Status: DC

## 2014-10-23 MED ORDER — DEXTROSE 5 % IV SOLN: 1.0000 g | Freq: Once | INTRAVENOUS | Status: AC

## 2014-10-23 MED ORDER — IOHEXOL 300 MG/ML  SOLN
50.0000 mL | Freq: Once | INTRAMUSCULAR | Status: AC | PRN
  Administered 2014-10-23: 50 mL via ORAL

## 2014-10-23 MED ORDER — CEFTRIAXONE SODIUM 1 G IJ SOLR
INTRAMUSCULAR | Status: AC
  Administered 2014-10-23: 1000 mg
  Filled 2014-10-23: qty 10

## 2014-10-23 NOTE — ED Notes (Signed)
C/o occ prod cough, decreased appetite x 1-2 weeks-was in hosp 2 week sago for GB removal

## 2014-10-23 NOTE — ED Notes (Signed)
Patient transported to X-ray 

## 2014-10-23 NOTE — ED Provider Notes (Signed)
CSN: [id number]     Arrival date & time 10/23/14  1957 History  This chart was scribed for Gregory OctaveStephen Crystallynn Noorani, MD by Evon Slackerrance Branch, ED Scribe. This patient was seen in room MH06/MH06 and the patient's care was started at 8:14 PM.     Chief Complaint  Patient presents with  . Cough   HPI HPI Comments: Gregory Butler is a 80 y.o. male with PMhx of HTN nad diabetes who presents to the Emergency Department complaining of decreased appetite onset 2 weeks ago. Pt states he has associated abdominal pain. Pt states that he also has a cough productive of clear sputum. Pt states that his symptoms started about 2 weeks ago after he was in the hospital for about 5 days after cholecystomy. Pt states that he is still having normal BM's. He denies CP, SOB, dizziness, light headiness, LOC, blood in stool or vomiting.   Past Medical History  Diagnosis Date  . Hypertension   . Diabetes mellitus without complication   . Arthritis   . Hyperlipemia   . GERD (gastroesophageal reflux disease)   . Aneurysm artery, iliac    Past Surgical History  Procedure Laterality Date  . Tumor excision Right     lower right abdominal  . Cholecystectomy     Family History  Problem Relation Age of Onset  . Heart disease Mother   . Heart disease Father    History  Substance Use Topics  . Smoking status: Former Smoker    Types: Cigarettes    Quit date: 11/29/2011  . Smokeless tobacco: Former NeurosurgeonUser    Quit date: 11/28/1953  . Alcohol Use: No    Review of Systems  Constitutional: Positive for appetite change.  Respiratory: Positive for cough. Negative for shortness of breath.   Cardiovascular: Negative for chest pain.  Gastrointestinal: Positive for abdominal pain. Negative for vomiting and blood in stool.  Neurological: Negative for dizziness, syncope and light-headedness.   A complete 10 system review of systems was obtained and all systems are negative except as noted in the HPI and PMH.    Allergies   Review of patient's allergies indicates no known allergies.  Home Medications   Prior to Admission medications   Medication Sig Start Date End Date Taking? Authorizing Provider  amLODipine (NORVASC) 5 MG tablet Take 5 mg by mouth daily.    Historical Provider, MD  aspirin 81 MG tablet Take 81 mg by mouth daily.    Historical Provider, MD  esomeprazole (NEXIUM) 20 MG capsule Take 20 mg by mouth daily before breakfast.    Historical Provider, MD  ezetimibe (ZETIA) 10 MG tablet Take 10 mg by mouth daily.    Historical Provider, MD  magnesium oxide (MAG-OX) 400 MG tablet Take 250 mg by mouth daily.    Historical Provider, MD  metoprolol (LOPRESSOR) 50 MG tablet Take 50 mg by mouth 2 (two) times daily.    Historical Provider, MD  ondansetron (ZOFRAN ODT) 8 MG disintegrating tablet 8mg  ODT q4 hours prn nausea 10/27/13   [name], MD  pravastatin (PRAVACHOL) 80 MG tablet Take 80 mg by mouth daily.    Historical Provider, MD  rosuvastatin (CRESTOR) 20 MG tablet Take 20 mg by mouth daily.    Historical Provider, MD   Triage vitals: BP 160/65 mmHg  Pulse 124  Temp(Src) 98.4 F (36.9 C) (Oral)  Resp 20  Ht 5\' 11"  (1.803 m)  Wt 200 lb (90.719 kg)  BMI 27.91 kg/m2  SpO2 98%  Physical  Exam  Constitutional: He is oriented to person, place, and time. He appears well-developed and well-nourished. No distress.  HENT:  Head: Normocephalic and atraumatic.  Mouth/Throat: Oropharynx is clear and moist. No oropharyngeal exudate.  Eyes: EOM are normal. Pupils are equal, round, and reactive to light.  Pale conjunctiva.  Neck: Normal range of motion. Neck supple.  No meningismus.  Cardiovascular: Regular rhythm, normal heart sounds and intact distal pulses.  Tachycardia present.   No murmur heard. Pulmonary/Chest: Effort normal and breath sounds normal. No respiratory distress.  Abdominal: Soft. There is tenderness in the right upper quadrant and right lower quadrant. There is no rebound  and no guarding.  well healed surgical incisions.   Genitourinary:  No fecal impaction, no gross blood. Chaperone present   Musculoskeletal: Normal range of motion. He exhibits no edema or tenderness.  Neurological: He is alert and oriented to person, place, and time. No cranial nerve deficit. He exhibits normal muscle tone. Coordination normal.  No ataxia on finger to nose bilaterally. No pronator drift. 5/5 strength throughout. CN 2-12 intact. Negative Romberg. Equal grip strength. Sensation intact. Gait is normal.   Skin: Skin is warm.  Psychiatric: He has a normal mood and affect. His behavior is normal.  Nursing note and vitals reviewed.   ED Course  Procedures (including critical care time) DIAGNOSTIC STUDIES: Oxygen Saturation is 98% on RA, normal by my interpretation.    COORDINATION OF CARE: 8:25 PM-Discussed treatment plan with pt at bedside and pt agreed to plan.     Labs Review Labs Reviewed  CBC WITH DIFFERENTIAL - Abnormal; Notable for the following:    WBC 14.1 (*)    Neutrophils Relative % 82 (*)    Neutro Abs 11.5 (*)    Lymphocytes Relative 9 (*)    Monocytes Absolute 1.3 (*)    All other components within normal limits  COMPREHENSIVE METABOLIC PANEL - Abnormal; Notable for the following:    Sodium 136 (*)    Glucose, Bld 149 (*)    Albumin 3.0 (*)    Total Bilirubin 1.7 (*)    GFR calc non Af Amer 55 (*)    GFR calc Af Amer 64 (*)    Anion gap 18 (*)    All other components within normal limits  LIPASE, BLOOD - Abnormal; Notable for the following:    Lipase 10 (*)    All other components within normal limits  URINALYSIS, ROUTINE W REFLEX MICROSCOPIC - Abnormal; Notable for the following:    Color, Urine ORANGE (*)    APPearance CLOUDY (*)    Hgb urine dipstick MODERATE (*)    Bilirubin Urine SMALL (*)    Ketones, ur 15 (*)    Protein, ur 100 (*)    Leukocytes, UA MODERATE (*)    All other components within normal limits  URINE MICROSCOPIC-ADD ON  - Abnormal; Notable for the following:    Squamous Epithelial / LPF FEW (*)    Bacteria, UA MANY (*)    Casts GRANULAR CAST (*)    All other components within normal limits  D-DIMER, QUANTITATIVE - Abnormal; Notable for the following:    D-Dimer, Quant 1.99 (*)    All other components within normal limits  CULTURE, BLOOD (ROUTINE X 2)  CULTURE, BLOOD (ROUTINE X 2)  TROPONIN I  OCCULT BLOOD X 1 CARD TO LAB, STOOL  I-STAT CG4 LACTIC ACID, ED  I-STAT VENOUS BLOOD GAS, ED    Imaging Review Dg Chest 2 View  10/23/2014   CLINICAL DATA:  Intermittent cough with a weakness for 2 weeks.  EXAM: CHEST  2 VIEW  COMPARISON:  10/06/2014  FINDINGS: Lungs are adequately inflated with improved perihilar markings which may reflect mild persistent vascular congestion. No focal consolidation or effusion. Cardiomediastinal silhouette and remainder the exam is unchanged.  IMPRESSION: Improved perihilar markings which may reflect minimal residual vascular congestion.   Electronically Signed   By: Elberta Fortis M.D.   On: 10/23/2014 21:08   Ct Abdomen Pelvis W Contrast  10/23/2014   CLINICAL DATA:  Decreased appetite with abdominal pain for 2 weeks. Productive cough. Symptoms began after previous hospitalization for cholecystectomy.  EXAM: CT ABDOMEN AND PELVIS WITH CONTRAST  TECHNIQUE: Multidetector CT imaging of the abdomen and pelvis was performed using the standard protocol following bolus administration of intravenous contrast.  CONTRAST:  50mL OMNIPAQUE IOHEXOL 300 MG/ML SOLN, OMNIPAQUE IOHEXOL 300 MG/ML SOLN  COMPARISON:  10/27/2013  FINDINGS: Mild dependent changes in the lung bases. Moderate-sized esophageal hiatal hernia.  Complex cystic structure is in the left lobe of the liver may represent adjacent simple cysts versus biliary cystadenoma. No change since prior study. A surgical absence of the gallbladder. The pancreas, spleen, adrenal glands, inferior vena cava, and retroperitoneal lymph nodes are  unremarkable. The calcification of the abdominal aorta without aneurysm. Aneurysm of the distal right iliac artery measuring 1.8 cm diameter. Multiple renal cysts. No hydronephrosis. No change since prior study. Small bowel and colon are unremarkable in appearance. Contrast material flows through to the rectum without evidence of obstruction. No free air or free fluid in the abdomen.  Pelvis: The appendix is normal. Bladder wall is mildly thickened. This could be due to infection or hypertrophy due to outlet obstruction. Marked enlargement of the prostate gland which measures 7.1 x 5.7 cm. No significant pelvic lymphadenopathy. No free or loculated pelvic fluid collections. Degenerative changes throughout the lumbar spine. No destructive bone lesions.  IMPRESSION: No focal acute process demonstrated that would account for patient's symptoms. Chronic changes including hepatic cysts versus biliary cystadenoma, multiple bilateral renal cysts, prostatic enlargement, aortic calcification, and iliac artery aneurysm. Bladder wall thickening may be due to infection or hypertrophy due to outlet obstruction.   Electronically Signed   By: Burman Nieves M.D.   On: 10/23/2014 23:15     EKG Interpretation   Date/Time:  Friday October 23 2014 20:32:17 EST Ventricular Rate:  110 PR Interval:  152 QRS Duration: 92 QT Interval:  332 QTC Calculation: 449 R Axis:   2 Text Interpretation:  Sinus tachycardia Minimal voltage criteria for LVH,  may be normal variant Cannot rule out Anterior infarct , age undetermined  Abnormal ECG No previous ECGs available Confirmed by Manus Gunning  MD, Otelia Hettinger  [phone]) on 10/23/2014 8:48:06 PM      MDM   Final diagnoses:  Abdominal pain  Urinary tract infection without hematuria, site unspecified  Sepsis, due to unspecified organism   Decreased appetite, cough, generalized weakness for the past 2 weeks. Had gallbladder [facility] 2 weeks ago [facility]. Poor  appetite patient appears dry   dry mucous membranes. Tachycardic. Urinalysis appears infected..  UA appears infected. Patient started on Rocephin. Leukocytosis of 14. Tachycardia persists to 110s. IV fluids given. CT of abdomen shows no acute pathology. No chest pain, shortness of breath or hypoxia to suggest PE.  With ongoing tachycardia, will admit back to [facility] for hydration and antibiotics. PE is considered but patient has  no chest pain, no shortness of breath no hypoxia.  Heart rate has improved to 106. Patient received 2 L IV fluid. He received IV Rocephin. Lactate normal.  Admission discussed with Dr. Lucretia Roers  at [facility]. She requests d-dimer and blood cultures.  D-dimer 1.99. Patient denies any chest pain or shortness of breath. Tachycardia is improving. No hypoxia. Unable to obtain chest CTA as patient has already received IV contrast. Dr. Lucretia Roers aware that he may need PE evaluation if tachycardia persists.   BP 110/47 mmHg  Pulse 109  Temp(Src) 98.4 F (36.9 C) (Oral)  Resp 16  Ht 5\' 11"  (1.803 m)  Wt 200 lb (90.719 kg)  BMI 27.91 kg/m2  SpO2 99%   I personally performed the services described in this documentation, which was scribed in my presence. The recorded information has been reviewed and is accurate.     Gregory Octave, MD 10/24/14 [phone]

## 2014-10-24 DIAGNOSIS — K219 Gastro-esophageal reflux disease without esophagitis: Secondary | ICD-10-CM | POA: Diagnosis not present

## 2014-10-24 DIAGNOSIS — E782 Mixed hyperlipidemia: Secondary | ICD-10-CM | POA: Diagnosis not present

## 2014-10-24 DIAGNOSIS — Z7982 Long term (current) use of aspirin: Secondary | ICD-10-CM | POA: Diagnosis not present

## 2014-10-24 DIAGNOSIS — Z79899 Other long term (current) drug therapy: Secondary | ICD-10-CM | POA: Diagnosis not present

## 2014-10-24 DIAGNOSIS — M1990 Unspecified osteoarthritis, unspecified site: Secondary | ICD-10-CM | POA: Diagnosis not present

## 2014-10-24 DIAGNOSIS — I10 Essential (primary) hypertension: Secondary | ICD-10-CM | POA: Diagnosis not present

## 2014-10-24 DIAGNOSIS — R000 Tachycardia, unspecified: Secondary | ICD-10-CM | POA: Diagnosis not present

## 2014-10-24 DIAGNOSIS — E119 Type 2 diabetes mellitus without complications: Secondary | ICD-10-CM | POA: Diagnosis not present

## 2014-10-24 DIAGNOSIS — N390 Urinary tract infection, site not specified: Secondary | ICD-10-CM | POA: Diagnosis not present

## 2014-10-24 DIAGNOSIS — R630 Anorexia: Secondary | ICD-10-CM | POA: Diagnosis not present

## 2014-10-24 DIAGNOSIS — R1011 Right upper quadrant pain: Secondary | ICD-10-CM | POA: Diagnosis not present

## 2014-10-24 DIAGNOSIS — Z87891 Personal history of nicotine dependence: Secondary | ICD-10-CM | POA: Diagnosis not present

## 2014-10-24 DIAGNOSIS — R05 Cough: Secondary | ICD-10-CM | POA: Diagnosis present

## 2014-10-24 DIAGNOSIS — A419 Sepsis, unspecified organism: Secondary | ICD-10-CM | POA: Diagnosis not present

## 2014-10-24 LAB — I-STAT CG4 LACTIC ACID, ED: LACTIC ACID, VENOUS: 0.84 mmol/L (ref 0.5–2.2)

## 2014-10-24 LAB — D-DIMER, QUANTITATIVE (NOT AT ARMC): D-Dimer, Quant: 1.99 ug/mL-FEU — ABNORMAL HIGH (ref 0.00–0.48)

## 2014-10-26 LAB — CULTURE, BLOOD (ROUTINE X 2)

## 2014-10-27 ENCOUNTER — Telehealth (HOSPITAL_BASED_OUTPATIENT_CLINIC_OR_DEPARTMENT_OTHER): Payer: Self-pay | Admitting: Emergency Medicine

## 2014-10-30 LAB — CULTURE, BLOOD (ROUTINE X 2): CULTURE: NO GROWTH

## 2014-12-04 ENCOUNTER — Ambulatory Visit: Payer: Medicare Other | Admitting: Family

## 2014-12-04 ENCOUNTER — Other Ambulatory Visit (HOSPITAL_COMMUNITY): Payer: Medicare Other

## 2015-02-03 ENCOUNTER — Encounter: Payer: Self-pay | Admitting: Podiatry

## 2015-02-03 ENCOUNTER — Ambulatory Visit (INDEPENDENT_AMBULATORY_CARE_PROVIDER_SITE_OTHER): Payer: Medicare Other | Admitting: Podiatry

## 2015-02-03 VITALS — BP 187/80 | HR 76 | Ht 71.0 in | Wt 227.0 lb

## 2015-02-03 DIAGNOSIS — B351 Tinea unguium: Secondary | ICD-10-CM | POA: Diagnosis not present

## 2015-02-03 DIAGNOSIS — M79606 Pain in leg, unspecified: Secondary | ICD-10-CM

## 2015-02-03 NOTE — Progress Notes (Signed)
Subjective:  81 y.o. year old male patient presents complaining of painful nails. Patient requests toe nails trimmed.  His vital signs were abnormal check in time. BP repeated 20 minutes later. Pressure came down from 204 to 187 systolic. Advised to go home and take his medication.   Objective: Dermatologic:  Thick dystrophic nails x 10.  Vascular:  All pedal pulses palpable.  No edema or erythema on foot.  Orthopedic:  Rectus foot without gross deformities.  Neurologic:  All epicritic and tactile sensations grossly intact.   Assessment:  Dystrophic mycotic nails x 10.  Painful nails both feet.   Treatment: All mycotic nails debrided.  Patient will return as needed

## 2015-11-20 IMAGING — CT CT ABD-PELV W/ CM
2 of 5 series · 15 of 46 positions shown, 17 images · IV contrast (APPLIED)
Comparison: 10/27/2013

CLINICAL DATA: Decreased appetite with abdominal pain for 2 weeks.
Productive cough. Symptoms began after previous hospitalization for
cholecystectomy.

EXAM:
CT ABDOMEN AND PELVIS WITH CONTRAST
TECHNIQUE: Multidetector CT imaging of the abdomen and pelvis was performed
using the standard protocol following bolus administration of
intravenous contrast.
CONTRAST:  50mL OMNIPAQUE IOHEXOL 300 MG/ML SOLN, 100mL OMNIPAQUE
IOHEXOL 300 MG/ML SOLN

[Series 2: abd/pelvis 5.0 b31f · axial · 0.71mm/px · z∈[-322,+138]mm · 12 of 102 slices shown, 14 images]
[im 5/102  soft-tissue]
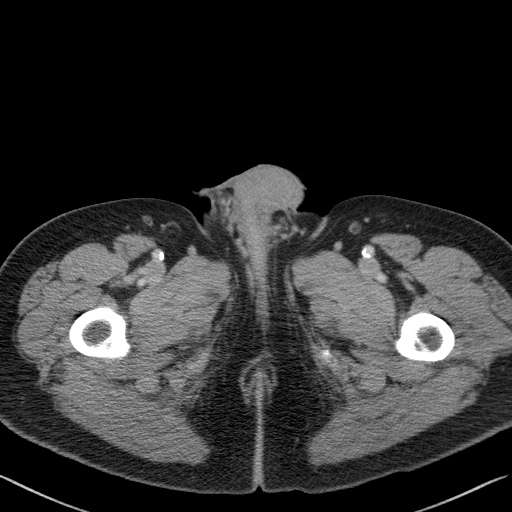
[im 5/102  bone]
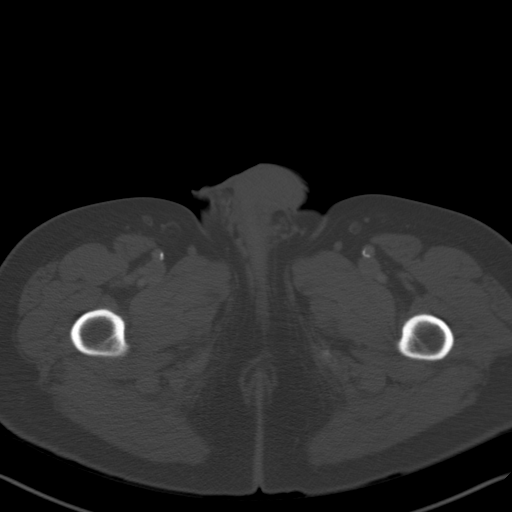
[im 15/102  soft-tissue]
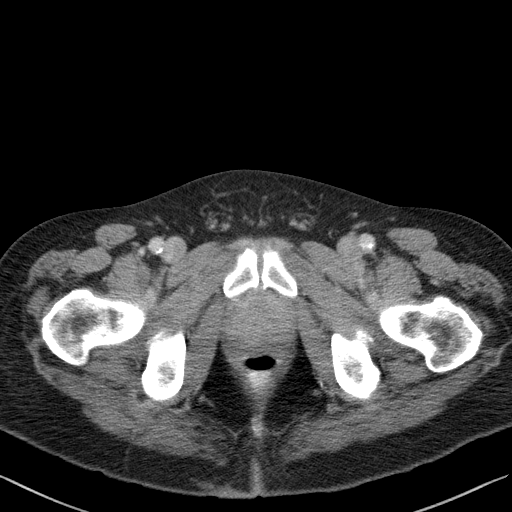
[im 25/102  soft-tissue]
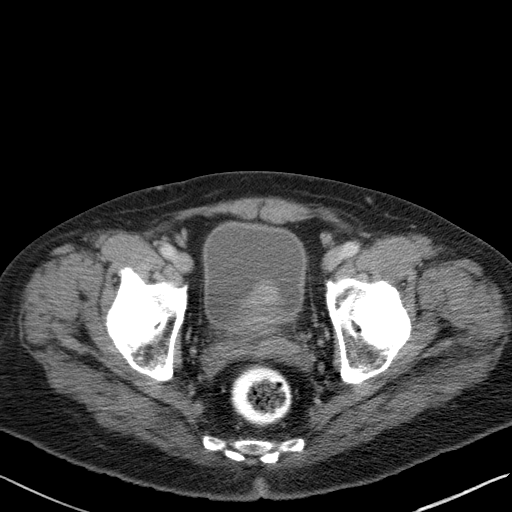
[im 29/102  soft-tissue]
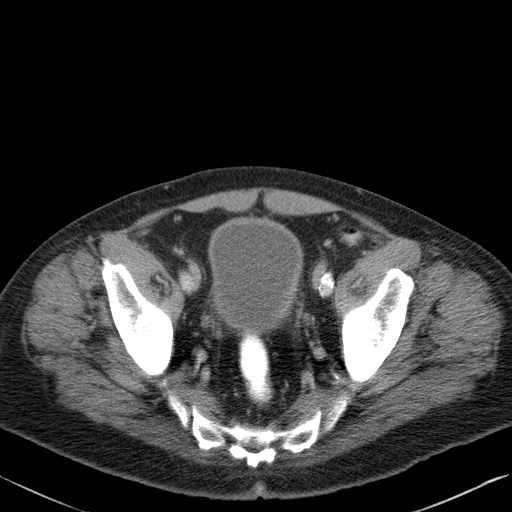
[im 39/102  soft-tissue]
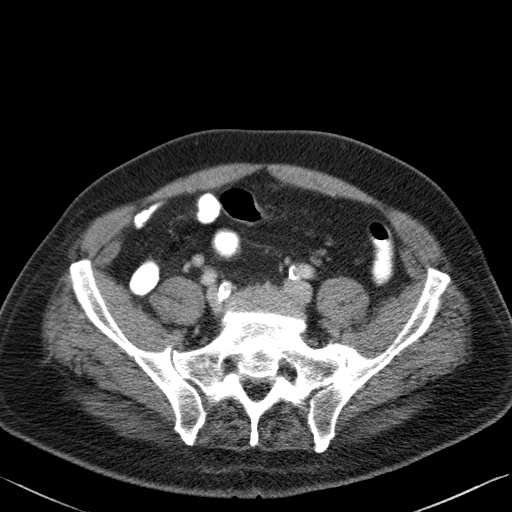
[im 49/102  soft-tissue]
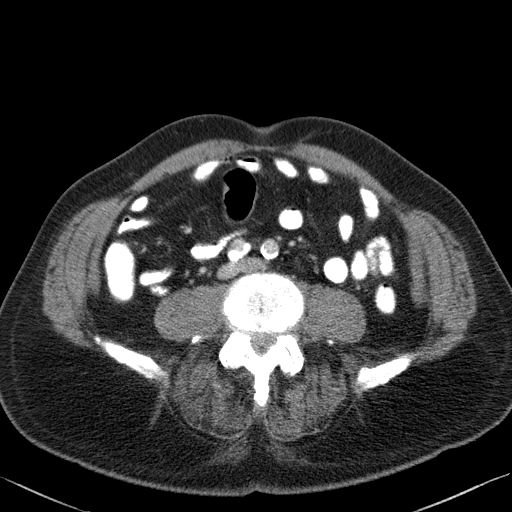
[im 53/102  soft-tissue]
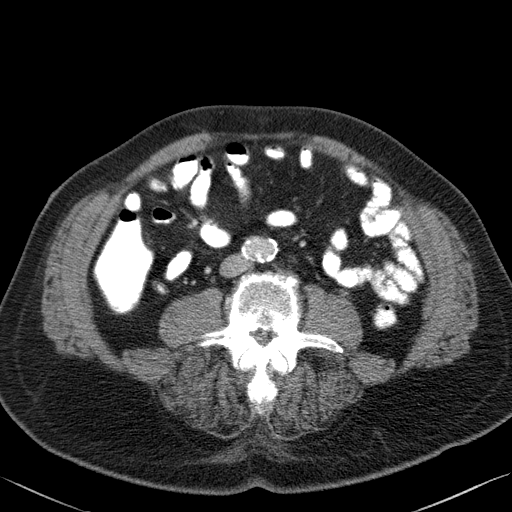
[im 63/102  soft-tissue]
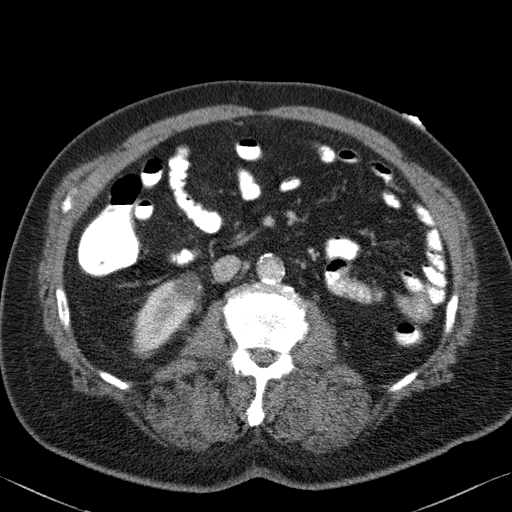
[im 73/102  soft-tissue]
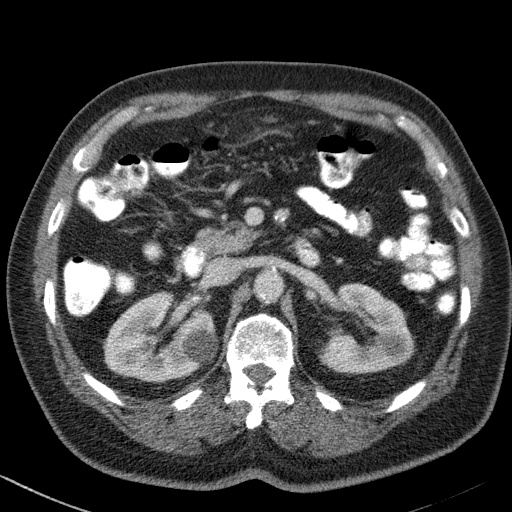
[im 73/102  bone]
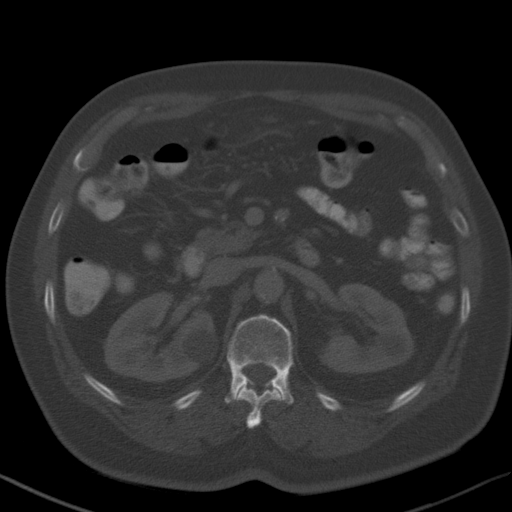
[im 77/102  soft-tissue]
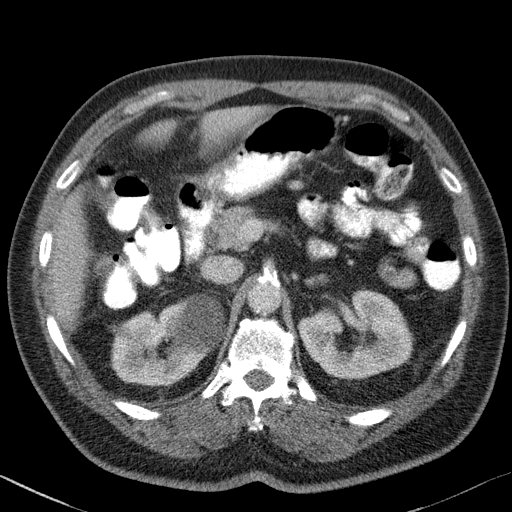
[im 87/102  soft-tissue]
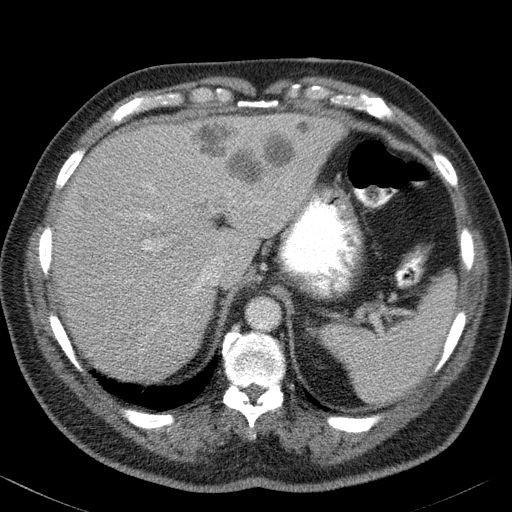
[im 97/102  soft-tissue]
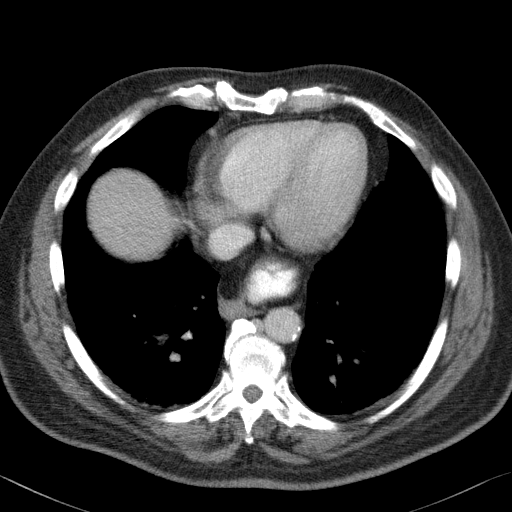

[Series 5: abd/pelvis 3.0 coronal · coronal · 0.70mm/px · 3 of 104 slices shown]
[im 35/104  soft-tissue]
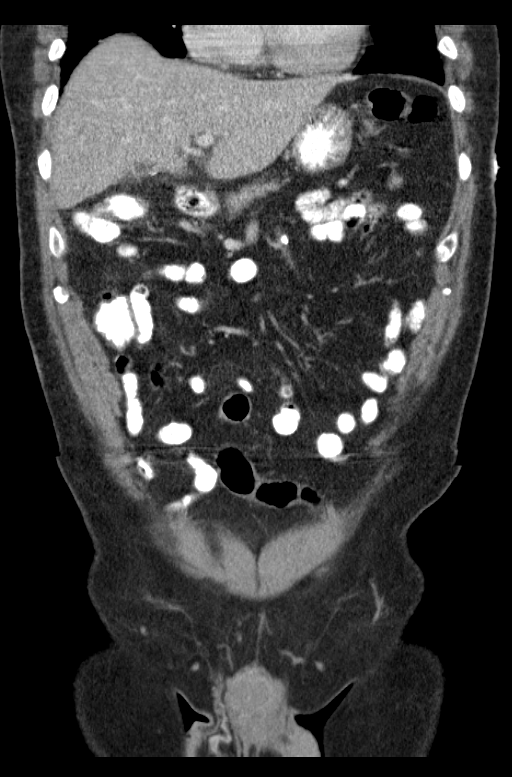
[im 46/104  soft-tissue]
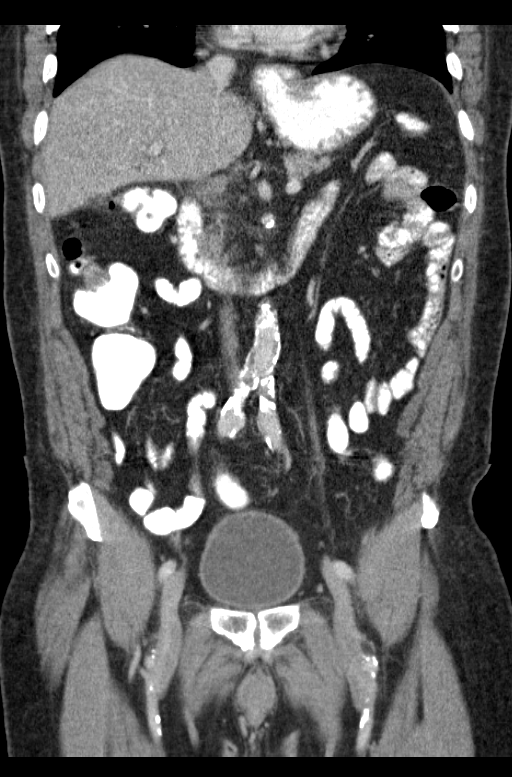
[im 58/104  soft-tissue]
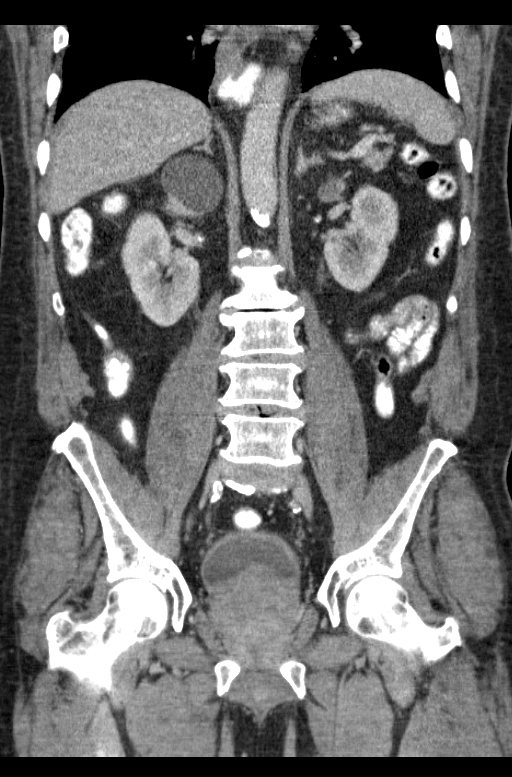

[15 of 46 positions shown; findings below may reference images not displayed]

FINDINGS: Mild dependent changes in the lung bases. Moderate-sized esophageal
hiatal hernia.

Complex cystic structure is in the left lobe of the liver may
represent adjacent simple cysts versus biliary cystadenoma. No
change since prior study. A surgical absence of the gallbladder. The
pancreas, spleen, adrenal glands, inferior vena cava, and
retroperitoneal lymph nodes are unremarkable. The calcification of
the abdominal aorta without aneurysm. Aneurysm of the distal right
iliac artery measuring 1.8 cm diameter. Multiple renal cysts. No
hydronephrosis. No change since prior study. Small bowel and colon
are unremarkable in appearance. Contrast material flows through to
the rectum without evidence of obstruction. No free air or free
fluid in the abdomen.

Pelvis: The appendix is normal. Bladder wall is mildly thickened.
This could be due to infection or hypertrophy due to outlet
obstruction. Marked enlargement of the prostate gland which measures
7.1 x 5.7 cm. No significant pelvic lymphadenopathy. No free or
loculated pelvic fluid collections. Degenerative changes throughout
the lumbar spine. No destructive bone lesions.
IMPRESSION: No focal acute process demonstrated that would account for patient's
symptoms. Chronic changes including hepatic cysts versus biliary
cystadenoma, multiple bilateral renal cysts, prostatic enlargement,
aortic calcification, and iliac artery aneurysm. Bladder wall
thickening may be due to infection or hypertrophy due to outlet
obstruction.

## 2015-11-20 IMAGING — CR DG CHEST 2V
2 series · 2 of 2 positions shown · non-contrast
Comparison: 10/06/2014

CLINICAL DATA: Intermittent cough with a weakness for 2 weeks.

EXAM:
CHEST  2 VIEW

[w chest pa]
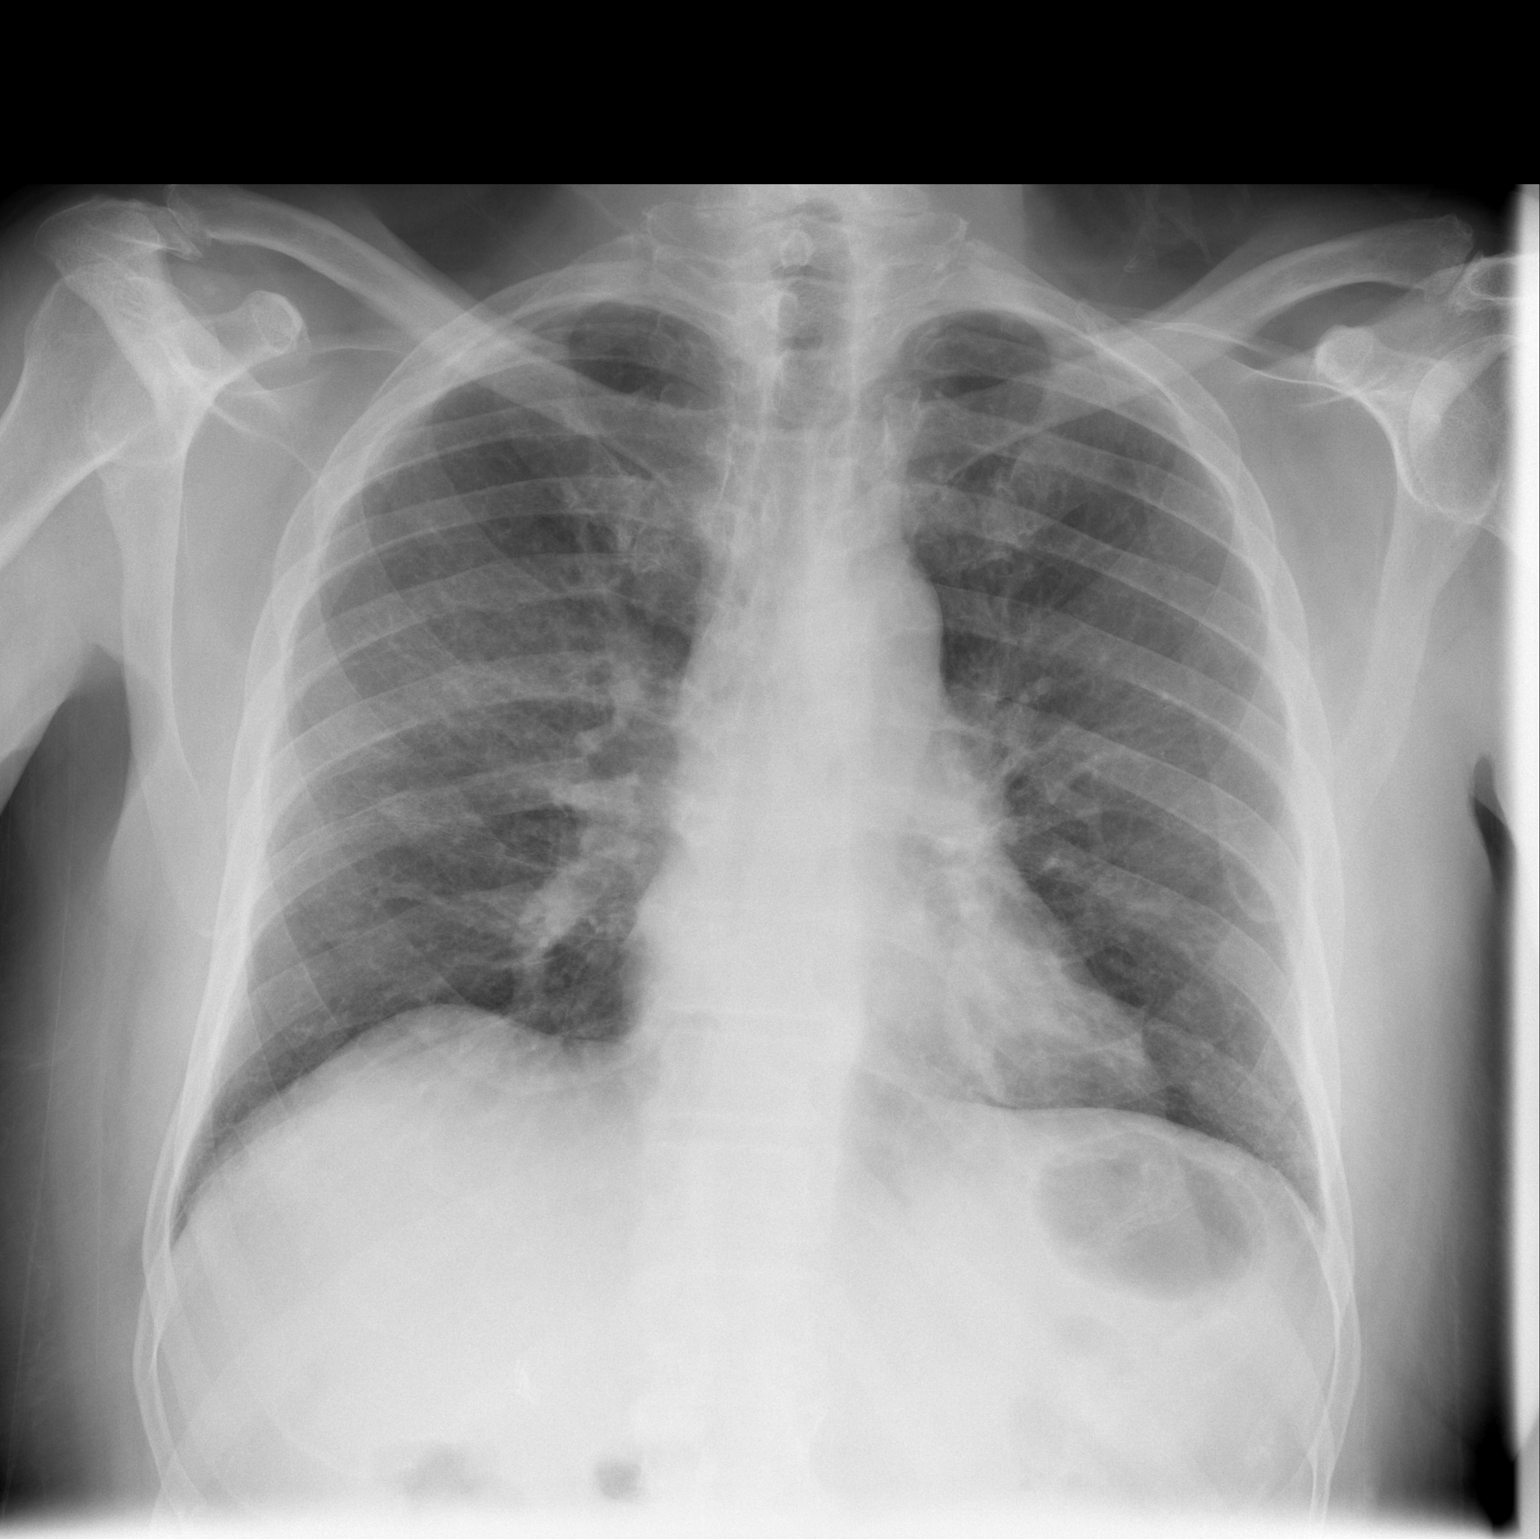

[w chest lat]
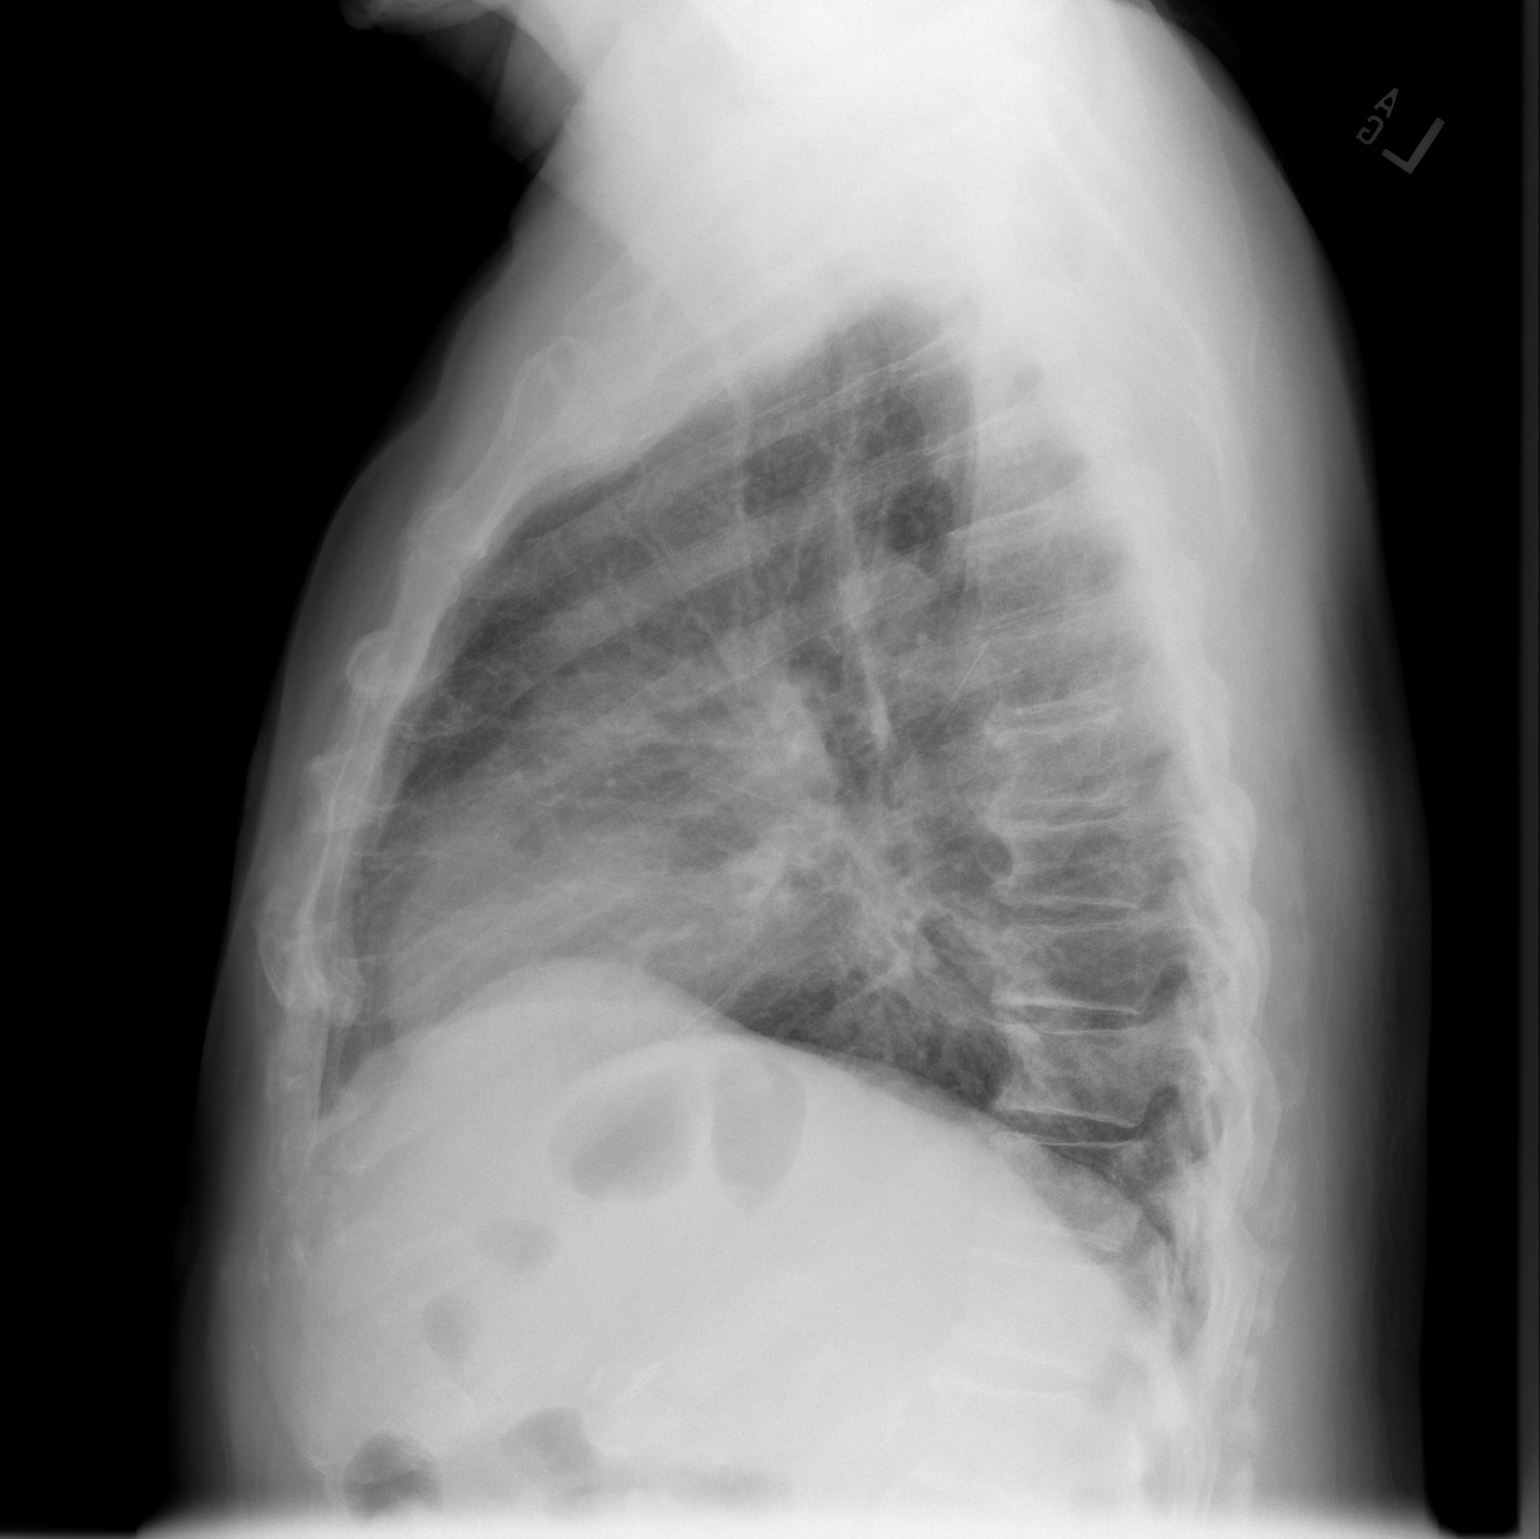

[2 of 2 positions shown; findings below may reference images not displayed]

FINDINGS: Lungs are adequately inflated with improved perihilar markings which
may reflect mild persistent vascular congestion. No focal
consolidation or effusion. Cardiomediastinal silhouette and
remainder the exam is unchanged.
IMPRESSION: Improved perihilar markings which may reflect minimal residual
vascular congestion.

## 2016-04-18 ENCOUNTER — Encounter: Payer: Self-pay | Admitting: Podiatry

## 2016-04-18 ENCOUNTER — Ambulatory Visit (INDEPENDENT_AMBULATORY_CARE_PROVIDER_SITE_OTHER): Payer: Medicare Other | Admitting: Podiatry

## 2016-04-18 VITALS — BP 163/72 | HR 73

## 2016-04-18 DIAGNOSIS — M79673 Pain in unspecified foot: Secondary | ICD-10-CM

## 2016-04-18 DIAGNOSIS — B351 Tinea unguium: Secondary | ICD-10-CM | POA: Diagnosis not present

## 2016-04-18 DIAGNOSIS — M79606 Pain in leg, unspecified: Secondary | ICD-10-CM

## 2016-04-18 NOTE — Patient Instructions (Signed)
Seen for hypertrophic nails. All nails debrided. Return in 3 months or as needed.  

## 2016-04-18 NOTE — Progress Notes (Signed)
Subjective:  82 y.o. year old male patient presents complaining of painful nails. Patient requests toe nails trimmed.   Objective: Dermatologic:  Thick dystrophic nails x 10.  Vascular:  All pedal pulses palpable.  No edema or erythema on foot.  Orthopedic:  Rectus foot without gross deformities.  Neurologic:  All epicritic and tactile sensations grossly intact.   Assessment:  Dystrophic mycotic nails x 10.  Painful nails both feet.   Treatment: All mycotic nails debrided.  Patient will return as needed

## 2016-07-19 ENCOUNTER — Ambulatory Visit: Payer: Medicare Other | Admitting: Podiatry

## 2017-10-02 ENCOUNTER — Encounter: Payer: Self-pay | Admitting: Podiatry

## 2017-10-02 ENCOUNTER — Ambulatory Visit: Payer: Medicare Other | Admitting: Podiatry

## 2017-10-02 DIAGNOSIS — M79606 Pain in leg, unspecified: Secondary | ICD-10-CM | POA: Diagnosis not present

## 2017-10-02 DIAGNOSIS — B351 Tinea unguium: Secondary | ICD-10-CM | POA: Diagnosis not present

## 2017-10-02 NOTE — Patient Instructions (Signed)
Seen for hypertrophic nails. All nails debrided. Return in 3 months or as needed.  

## 2017-10-02 NOTE — Progress Notes (Signed)
Subjective: 83 y.o. year old male patient presents complaining of painful nails. Patient requests toe nails trimmed.  He was diagnosed with diabetes but not on medication at this time.  Objective: Dermatologic: Thick yellow deformed nails with fungal debris 1-5 bilateral. Vascular: Pedal pulses are all palpable. Orthopedic: Contracted lesser digits bilateral. Neurologic: All epicritic and tactile sensations grossly intact.  Assessment: Dystrophic mycotic nails x 10.  Pain in both feet for thick dystrophic nails.  Treatment: All mycotic nails debrided.  Return in 3 months or as needed.

## 2018-01-02 ENCOUNTER — Ambulatory Visit: Payer: Self-pay | Admitting: Podiatry

## 2019-06-18 ENCOUNTER — Encounter (HOSPITAL_BASED_OUTPATIENT_CLINIC_OR_DEPARTMENT_OTHER): Payer: Self-pay | Admitting: Emergency Medicine

## 2019-06-18 ENCOUNTER — Other Ambulatory Visit: Payer: Self-pay

## 2019-06-18 ENCOUNTER — Emergency Department (HOSPITAL_BASED_OUTPATIENT_CLINIC_OR_DEPARTMENT_OTHER): Payer: Medicare Other

## 2019-06-18 ENCOUNTER — Emergency Department (HOSPITAL_BASED_OUTPATIENT_CLINIC_OR_DEPARTMENT_OTHER)
Admission: EM | Admit: 2019-06-18 | Discharge: 2019-06-18 | Disposition: A | Discharge status: Home / Self Care | Payer: Medicare Other | Attending: Emergency Medicine | Admitting: Emergency Medicine

## 2019-06-18 DIAGNOSIS — Z7982 Long term (current) use of aspirin: Secondary | ICD-10-CM | POA: Insufficient documentation

## 2019-06-18 DIAGNOSIS — I10 Essential (primary) hypertension: Secondary | ICD-10-CM | POA: Insufficient documentation

## 2019-06-18 DIAGNOSIS — Z87891 Personal history of nicotine dependence: Secondary | ICD-10-CM | POA: Diagnosis not present

## 2019-06-18 DIAGNOSIS — Z79899 Other long term (current) drug therapy: Secondary | ICD-10-CM | POA: Diagnosis not present

## 2019-06-18 DIAGNOSIS — E119 Type 2 diabetes mellitus without complications: Secondary | ICD-10-CM | POA: Insufficient documentation

## 2019-06-18 DIAGNOSIS — R109 Unspecified abdominal pain: Secondary | ICD-10-CM | POA: Insufficient documentation

## 2019-06-18 LAB — COMPREHENSIVE METABOLIC PANEL
ALT: 18 U/L (ref 0–44)
AST: 24 U/L (ref 15–41)
Albumin: 3.9 g/dL (ref 3.5–5.0)
Alkaline Phosphatase: 71 U/L (ref 38–126)
Anion gap: 9 (ref 5–15)
BUN: 11 mg/dL (ref 8–23)
CO2: 24 mmol/L (ref 22–32)
Calcium: 9.6 mg/dL (ref 8.9–10.3)
Chloride: 105 mmol/L (ref 98–111)
Creatinine, Ser: 1.22 mg/dL (ref 0.61–1.24)
GFR calc Af Amer: >60 mL/min (ref >60)
GFR calc non Af Amer: 54 mL/min — ABNORMAL LOW (ref >60)
Glucose, Bld: 181 mg/dL — ABNORMAL HIGH (ref 70–99)
Potassium: 3.7 mmol/L (ref 3.5–5.1)
Sodium: 138 mmol/L (ref 135–145)
Total Bilirubin: 1.1 mg/dL (ref 0.3–1.2)
Total Protein: 7.1 g/dL (ref 6.5–8.1)

## 2019-06-18 LAB — CBC WITH DIFFERENTIAL/PLATELET
Abs Immature Granulocytes: 0.01 10*3/uL (ref 0.00–0.07)
Basophils Absolute: 0 10*3/uL (ref 0.0–0.1)
Basophils Relative: 1 %
Eosinophils Absolute: 0.1 10*3/uL (ref 0.0–0.5)
Eosinophils Relative: 3 %
HCT: 45.5 % (ref 39.0–52.0)
Hemoglobin: 15.3 g/dL (ref 13.0–17.0)
Immature Granulocytes: 0 %
Lymphocytes Relative: 30 %
Lymphs Abs: 1.2 10*3/uL (ref 0.7–4.0)
MCH: 30.2 pg (ref 26.0–34.0)
MCHC: 33.6 g/dL (ref 30.0–36.0)
MCV: 89.9 fL (ref 80.0–100.0)
Monocytes Absolute: 0.4 10*3/uL (ref 0.1–1.0)
Monocytes Relative: 10 %
Neutro Abs: 2.1 10*3/uL (ref 1.7–7.7)
Neutrophils Relative %: 56 %
Platelets: 163 10*3/uL (ref 150–400)
RBC: 5.06 MIL/uL (ref 4.22–5.81)
RDW: 13.4 % (ref 11.5–15.5)
WBC: 3.8 10*3/uL — ABNORMAL LOW (ref 4.0–10.5)
nRBC: 0 % (ref 0.0–0.2)

## 2019-06-18 LAB — URINALYSIS, MICROSCOPIC (REFLEX): WBC, UA: NONE SEEN WBC/hpf (ref 0–5)

## 2019-06-18 LAB — LIPASE, BLOOD: Lipase: 23 U/L (ref 11–51)

## 2019-06-18 LAB — URINALYSIS, ROUTINE W REFLEX MICROSCOPIC
Bilirubin Urine: NEGATIVE
Glucose, UA: NEGATIVE mg/dL
Ketones, ur: NEGATIVE mg/dL
Leukocytes,Ua: NEGATIVE
Nitrite: NEGATIVE
Protein, ur: 100 mg/dL — AB
Specific Gravity, Urine: 1.025 (ref 1.005–1.030)
pH: 6 (ref 5.0–8.0)

## 2019-06-18 MED ORDER — IOHEXOL 350 MG/ML SOLN
100.0000 mL | Freq: Once | INTRAVENOUS | Status: AC | PRN
  Administered 2019-06-18: 13:00:00 100 mL via INTRAVENOUS

## 2019-06-18 MED ORDER — CYCLOBENZAPRINE HCL 5 MG PO TABS: 5.0000 mg | ORAL_TABLET | Freq: Two times a day (BID) | ORAL | 0 refills | Status: AC | PRN

## 2019-06-18 NOTE — ED Triage Notes (Signed)
Right flank pain that started suddenly this morning.  Pain waxes and wanes. Denies any urinary sx.  No hx of kidney stones.Denies n/v/d.

## 2019-06-18 NOTE — ED Provider Notes (Signed)
[facility] EMERGENCY DEPARTMENT Provider Note   CSN: [id number] Arrival date & time: 06/18/19  1052    History   Chief Complaint Chief Complaint  Patient presents with  . Flank Pain    HPI Gregory Butler is a 85 y.o. male.     HPI   85 year old male with a history of iliac artery aneurysm, diabetes, hypertension, hyperlipidemia, presents with concern for sudden onset right flank pain.  Patient reports that the flank pain began this morning.  Reports that the pain was severe, 8-9 out of 10.  Reports that it has been waxing and waning.  Denies any pain at this time.  Reports that it came on suddenly and was severe.  No nausea, vomiting, diarrhea, urinary symptoms, or hematuria.  Denies any fevers or other symptoms.  He has not yet had his blood pressure medication today.  Past Medical History:  Diagnosis Date  . Aneurysm artery, iliac (HCC)   . Arthritis   . Diabetes mellitus without complication (HCC)   . GERD (gastroesophageal reflux disease)   . Hyperlipemia   . Hypertension     Patient Active Problem List   Diagnosis Date Noted  . Pain in lower limb 05/19/2014  . Aneurysm of iliac artery (HCC) 11/28/2013  . Onychomycosis 03/24/2013  . Pain in joint, ankle and foot 03/24/2013    Past Surgical History:  Procedure Laterality Date  . CHOLECYSTECTOMY    . TUMOR EXCISION Right    lower right abdominal        Home Medications    Prior to Admission medications   Medication Sig Start Date End Date Taking? Authorizing Provider  Apoaequorin (PREVAGEN EXTRA STRENGTH) 20 MG CAPS Take 20 mg by mouth daily.   Yes [provider]  furosemide (LASIX) 20 MG tablet Take 20 mg by mouth daily.   Yes [provider]  metoprolol tartrate (LOPRESSOR) 100 MG tablet Take by mouth. 09/19/15  Yes [provider]  amLODipine (NORVASC) 5 MG tablet Take 5 mg by mouth daily.    [provider]  aspirin 81 MG tablet Take 81 mg by mouth daily.     [provider]  cyclobenzaprine (FLEXERIL) 5 MG tablet Take 1 tablet (5 mg total) by mouth 2 (two) times daily as needed for up to 12 days for muscle spasms. 06/18/19 06/30/19  Alvira MondaySchlossman, Kiyo Heal, MD  rosuvastatin (CRESTOR) 20 MG tablet Take 20 mg by mouth daily.    [provider]    Family History Family History  Problem Relation Age of Onset  . Heart disease Mother   . Heart disease Father     Social History Social History   Tobacco Use  . Smoking status: Former Smoker    Types: Cigarettes    Quit date: 11/29/2011    Years since quitting: 7.5  . Smokeless tobacco: Former NeurosurgeonUser    Quit date: 11/28/1953  Substance Use Topics  . Alcohol use: No    Alcohol/week: 0.0 standard drinks  . Drug use: No     Allergies   Patient has no known allergies.   Review of Systems Review of Systems  Constitutional: Negative for fever.  HENT: Negative for sore throat.   Eyes: Negative for visual disturbance.  Respiratory: Negative for cough and shortness of breath.   Cardiovascular: Negative for chest pain.  Gastrointestinal: Positive for abdominal pain. Negative for diarrhea, nausea and vomiting.  Genitourinary: Positive for flank pain. Negative for difficulty urinating and dysuria.  Musculoskeletal: Negative for  back pain and neck stiffness.  Skin: Negative for rash.  Neurological: Negative for syncope and headaches.     Physical Exam Updated Vital Signs BP (!) 175/61 (BP Location: Right Arm)   Pulse 66   Temp 98.3 F (36.8 C) (Oral)   Resp 12   Ht 5\' 8"  (1.727 m)   Wt 98 kg   SpO2 96%   BMI 32.84 kg/m   Physical Exam Vitals signs and nursing note reviewed.  Constitutional:      General: He is not in acute distress.    Appearance: He is well-developed. He is not diaphoretic.  HENT:     Head: Normocephalic and atraumatic.  Eyes:     Conjunctiva/sclera: Conjunctivae normal.  Neck:     Musculoskeletal: Normal range of motion.  Cardiovascular:     Rate  and Rhythm: Normal rate and regular rhythm.     Heart sounds: Normal heart sounds.  Pulmonary:     Effort: Pulmonary effort is normal. No respiratory distress.     Breath sounds: Normal breath sounds.  Abdominal:     General: There is no distension.     Palpations: Abdomen is soft.     Tenderness: There is abdominal tenderness. There is right CVA tenderness. There is no guarding.  Skin:    General: Skin is warm and dry.  Neurological:     Mental Status: He is alert and oriented to person, place, and time.      ED Treatments / Results  Labs (all labs ordered are listed, but only abnormal results are displayed) Labs Reviewed  URINALYSIS, ROUTINE W REFLEX MICROSCOPIC - Abnormal; Notable for the following components:      Result Value   Hgb urine dipstick TRACE (*)    Protein, ur 100 (*)    All other components within normal limits  CBC WITH DIFFERENTIAL/PLATELET - Abnormal; Notable for the following components:   WBC 3.8 (*)    All other components within normal limits  COMPREHENSIVE METABOLIC PANEL - Abnormal; Notable for the following components:   Glucose, Bld 181 (*)    GFR calc non Af Amer 54 (*)    All other components within normal limits  URINALYSIS, MICROSCOPIC (REFLEX) - Abnormal; Notable for the following components:   Bacteria, UA RARE (*)    All other components within normal limits  LIPASE, BLOOD    EKG None  Radiology Ct Angio Abd/pel W And/or Wo Contrast  Result Date: 06/18/2019 CLINICAL DATA:  Low back and right flank pain with suspicion of potential abdominal aortic aneurysm. EXAM: CT ANGIOGRAPHY ABDOMEN AND PELVIS WITH CONTRAST TECHNIQUE: Multidetector CT imaging of the abdomen and pelvis was performed using the standard protocol during bolus administration of intravenous contrast. Multiplanar reconstructed images and MIPs were obtained and reviewed to evaluate the vascular anatomy. CONTRAST:  100mL OMNIPAQUE IOHEXOL 350 MG/ML SOLN COMPARISON:  CT of the  abdomen and pelvis on 10/23/2014 FINDINGS: VASCULAR Aorta: Atherosclerosis of the abdominal aorta present without evidence of aneurysmal disease, aortic stenosis or aortic dissection. Celiac: Calcified plaque at the origin of the celiac axis causes approximately 60-70% stenosis. Distal branches appear open and demonstrate normal branching anatomy. SMA: Scattered calcified plaque throughout much of the SMA trunk without significant stenosis identified. Maximal degree of stenosis is approximately 30-40%. Renals: Bilateral single renal arteries present with mild plaque. No significant renal artery stenosis. IMA: The inferior mesenteric artery is normally patent. Inflow: Stable focal aneurysmal disease of the mid right common iliac artery measuring up  to 1.8-2.1 cm. Proximal Outflow: Scattered plaque in both common femoral arteries without significant stenosis or evidence of aneurysmal disease. Both femoral bifurcations are normally patent. Review of the MIP images confirms the above findings. NON-VASCULAR Lower chest: Progressive peripheral interstitial lung disease with probable fibrotic component at the lung bases since 2015. No pleural effusions. Moderate to large hiatal hernia again demonstrated. Hepatobiliary: Stable left lobe hepatic cysts. Prior cholecystectomy with no biliary dilatation identified. Pancreas: Unremarkable. No pancreatic ductal dilatation or surrounding inflammatory changes. Spleen: Normal in size without focal abnormality. Adrenals/Urinary Tract: Adrenal glands are unremarkable. There is some interval enlargement of upper pole right renal cysts which appear benign. Lower pole left renal cysts also shows some interval enlargement and appear benign. An interpolar anterior cyst previously was lower density and appears of similar size but now measures 49 Hounsfield units in internal density likely consistent with internal hemorrhage/proteinaceous debris. No hydronephrosis or renal calculi. The  bladder appears unremarkable with indentation again noted inferiorly from the prostate gland. Stomach/Bowel: Bowel shows no evidence of obstruction, ileus, inflammation, perforation or lesion. The appendix is normal. Lymphatic: No enlarged lymph nodes identified in the abdomen or pelvis. Reproductive: Again noted is a massively enlarged prostate gland. Other: No abnormal fluid collections, ascites or hernias. Musculoskeletal: Moderate degenerative disc disease throughout the visualized spine appears similar to slightly progressive since 2015. IMPRESSION: VASCULAR 1. Aortic atherosclerosis without evidence of abdominal aortic aneurysm or acute aortic pathology. 2. Stable aneurysmal disease of the mid right common iliac artery measuring up to 2.1 cm in maximum diameter. 3. 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 NON-VASCULAR 1. Progressive chronic lung disease at the visualized lung bases suggestive chronic interstitial disease and probable component of pulmonary fibrosis. 2. Interval enlargement of bilateral simple renal cysts as well as higher density of a similar-sized left anterior interpolar cyst which suggests interval internal hemorrhage or proteinaceous debris within the cyst. 3. Stable massively enlarged prostate gland. Electronically Signed   By: Irish LackGlenn  Yamagata M.D.   On: 06/18/2019 13:29    Procedures Procedures (including critical care time)  Medications Ordered in ED Medications  iohexol (OMNIPAQUE) 350 MG/ML injection 100 mL (100 mLs Intravenous Contrast Given 06/18/19 1257)     Initial Impression / Assessment and Plan / ED Course  I have reviewed the triage vital signs and the nursing notes.  Pertinent labs & imaging results that were available during my care of the patient were reviewed by me and considered in my medical decision making (see chart for details).         85 year old male with a history of iliac artery aneurysm,  diabetes, hypertension, hyperlipidemia, presents with concern for sudden onset right flank pain.  DDx includes ruptured iliac aneurysm, AAA, nephrolithiasis, pyelonephritis, msk pain.    CTA abdomen pelvis completed showing no evidence of acute abnormalities.  No sign of UTI.  Patient without pain at this time. Suspect likely muscle spasm, consider degenerative disc disease. No red flags/signs of cauda equina or spinal cord compression.  Given rx for flexeril. Patient discharged in stable condition with understanding of reasons to return.   Final Clinical Impressions(s) / ED Diagnoses   Final diagnoses:  Right flank pain    ED Discharge Orders         Ordered    cyclobenzaprine (FLEXERIL) 5 MG tablet  2 times daily PRN     06/18/19 1342           Nasrin Lanzo,  Junie Panning, MD 06/19/19 [phone]

## 2023-05-26 ENCOUNTER — Inpatient Hospital Stay (HOSPITAL_BASED_OUTPATIENT_CLINIC_OR_DEPARTMENT_OTHER)
Admission: EM | Admit: 2023-05-26 | Discharge: 2023-06-03 | DRG: 435 | Disposition: A | Discharge status: Hospice | Payer: Medicare Other | Attending: Internal Medicine | Admitting: Internal Medicine

## 2023-05-26 ENCOUNTER — Other Ambulatory Visit: Payer: Self-pay

## 2023-05-26 ENCOUNTER — Emergency Department (HOSPITAL_BASED_OUTPATIENT_CLINIC_OR_DEPARTMENT_OTHER): Payer: Medicare Other

## 2023-05-26 DIAGNOSIS — C787 Secondary malignant neoplasm of liver and intrahepatic bile duct: Principal | ICD-10-CM | POA: Diagnosis present

## 2023-05-26 DIAGNOSIS — Z66 Do not resuscitate: Secondary | ICD-10-CM | POA: Diagnosis present

## 2023-05-26 DIAGNOSIS — C3411 Malignant neoplasm of upper lobe, right bronchus or lung: Secondary | ICD-10-CM | POA: Diagnosis present

## 2023-05-26 DIAGNOSIS — E872 Acidosis, unspecified: Secondary | ICD-10-CM | POA: Diagnosis present

## 2023-05-26 DIAGNOSIS — E860 Dehydration: Secondary | ICD-10-CM | POA: Diagnosis present

## 2023-05-26 DIAGNOSIS — N179 Acute kidney failure, unspecified: Secondary | ICD-10-CM | POA: Diagnosis present

## 2023-05-26 DIAGNOSIS — Z6832 Body mass index (BMI) 32.0-32.9, adult: Secondary | ICD-10-CM

## 2023-05-26 DIAGNOSIS — Z781 Physical restraint status: Secondary | ICD-10-CM

## 2023-05-26 DIAGNOSIS — Z515 Encounter for palliative care: Secondary | ICD-10-CM

## 2023-05-26 DIAGNOSIS — R918 Other nonspecific abnormal finding of lung field: Secondary | ICD-10-CM

## 2023-05-26 DIAGNOSIS — E1165 Type 2 diabetes mellitus with hyperglycemia: Secondary | ICD-10-CM | POA: Diagnosis present

## 2023-05-26 DIAGNOSIS — Z7982 Long term (current) use of aspirin: Secondary | ICD-10-CM

## 2023-05-26 DIAGNOSIS — R739 Hyperglycemia, unspecified: Secondary | ICD-10-CM

## 2023-05-26 DIAGNOSIS — K219 Gastro-esophageal reflux disease without esophagitis: Secondary | ICD-10-CM | POA: Diagnosis present

## 2023-05-26 DIAGNOSIS — R571 Hypovolemic shock: Principal | ICD-10-CM

## 2023-05-26 DIAGNOSIS — Z79899 Other long term (current) drug therapy: Secondary | ICD-10-CM

## 2023-05-26 DIAGNOSIS — E876 Hypokalemia: Secondary | ICD-10-CM | POA: Diagnosis present

## 2023-05-26 DIAGNOSIS — C3490 Malignant neoplasm of unspecified part of unspecified bronchus or lung: Secondary | ICD-10-CM

## 2023-05-26 DIAGNOSIS — I10 Essential (primary) hypertension: Secondary | ICD-10-CM | POA: Diagnosis present

## 2023-05-26 DIAGNOSIS — E785 Hyperlipidemia, unspecified: Secondary | ICD-10-CM | POA: Diagnosis present

## 2023-05-26 DIAGNOSIS — Z87891 Personal history of nicotine dependence: Secondary | ICD-10-CM

## 2023-05-26 DIAGNOSIS — E861 Hypovolemia: Secondary | ICD-10-CM | POA: Diagnosis present

## 2023-05-26 DIAGNOSIS — R1310 Dysphagia, unspecified: Secondary | ICD-10-CM | POA: Diagnosis present

## 2023-05-26 DIAGNOSIS — Z8249 Family history of ischemic heart disease and other diseases of the circulatory system: Secondary | ICD-10-CM

## 2023-05-26 DIAGNOSIS — C771 Secondary and unspecified malignant neoplasm of intrathoracic lymph nodes: Secondary | ICD-10-CM | POA: Diagnosis present

## 2023-05-26 DIAGNOSIS — I959 Hypotension, unspecified: Secondary | ICD-10-CM | POA: Diagnosis present

## 2023-05-26 DIAGNOSIS — I4891 Unspecified atrial fibrillation: Secondary | ICD-10-CM | POA: Diagnosis present

## 2023-05-26 DIAGNOSIS — G9341 Metabolic encephalopathy: Secondary | ICD-10-CM | POA: Diagnosis present

## 2023-05-26 LAB — CBC WITH DIFFERENTIAL/PLATELET
Abs Immature Granulocytes: 0.04 10*3/uL (ref 0.00–0.07)
Basophils Absolute: 0 10*3/uL (ref 0.0–0.1)
Basophils Relative: 1 %
Eosinophils Absolute: 0 10*3/uL (ref 0.0–0.5)
Eosinophils Relative: 0 %
HCT: 39.2 % (ref 39.0–52.0)
Hemoglobin: 12.9 g/dL — ABNORMAL LOW (ref 13.0–17.0)
Immature Granulocytes: 1 %
Lymphocytes Relative: 20 %
Lymphs Abs: 1.6 10*3/uL (ref 0.7–4.0)
MCH: 28.7 pg (ref 26.0–34.0)
MCHC: 32.9 g/dL (ref 30.0–36.0)
MCV: 87.1 fL (ref 80.0–100.0)
Monocytes Absolute: 0.7 10*3/uL (ref 0.1–1.0)
Monocytes Relative: 9 %
Neutro Abs: 5.6 10*3/uL (ref 1.7–7.7)
Neutrophils Relative %: 69 %
Platelets: 399 10*3/uL (ref 150–400)
RBC: 4.5 MIL/uL (ref 4.22–5.81)
RDW: 14.9 % (ref 11.5–15.5)
WBC: 8 10*3/uL (ref 4.0–10.5)
nRBC: 0 % (ref 0.0–0.2)

## 2023-05-26 LAB — COMPREHENSIVE METABOLIC PANEL
ALT: 18 U/L (ref 0–44)
AST: 25 U/L (ref 15–41)
Albumin: 2.9 g/dL — ABNORMAL LOW (ref 3.5–5.0)
Alkaline Phosphatase: 89 U/L (ref 38–126)
Anion gap: 10 (ref 5–15)
BUN: 23 mg/dL (ref 8–23)
CO2: 23 mmol/L (ref 22–32)
Calcium: 13.3 mg/dL (ref 8.9–10.3)
Chloride: 99 mmol/L (ref 98–111)
Creatinine, Ser: 1.51 mg/dL — ABNORMAL HIGH (ref 0.61–1.24)
GFR, Estimated: 44 mL/min — ABNORMAL LOW (ref >60)
Glucose, Bld: 142 mg/dL — ABNORMAL HIGH (ref 70–99)
Potassium: 4.3 mmol/L (ref 3.5–5.1)
Sodium: 132 mmol/L — ABNORMAL LOW (ref 135–145)
Total Bilirubin: 0.7 mg/dL (ref 0.3–1.2)
Total Protein: 7.4 g/dL (ref 6.5–8.1)

## 2023-05-26 LAB — URINALYSIS, W/ REFLEX TO CULTURE (INFECTION SUSPECTED)
Bacteria, UA: NONE SEEN
Bilirubin Urine: NEGATIVE
Glucose, UA: >=500 mg/dL — AB
Hgb urine dipstick: NEGATIVE
Ketones, ur: NEGATIVE mg/dL
Leukocytes,Ua: NEGATIVE
Nitrite: NEGATIVE
Protein, ur: 30 mg/dL — AB
Specific Gravity, Urine: 1.02 (ref 1.005–1.030)
pH: 5.5 (ref 5.0–8.0)

## 2023-05-26 LAB — TROPONIN I (HIGH SENSITIVITY)
Troponin I (High Sensitivity): 14 ng/L (ref <18)
Troponin I (High Sensitivity): 7 ng/L (ref <18)

## 2023-05-26 LAB — LACTIC ACID, PLASMA
Lactic Acid, Venous: 2.3 mmol/L (ref 0.5–1.9)
Lactic Acid, Venous: 1.8 mmol/L (ref 0.5–1.9)

## 2023-05-26 LAB — PROTIME-INR
INR: 1.1 (ref 0.8–1.2)
Prothrombin Time: 14.5 seconds (ref 11.4–15.2)

## 2023-05-26 MED ORDER — LACTATED RINGERS IV SOLN: INTRAVENOUS | Status: DC

## 2023-05-26 MED ORDER — IOHEXOL 300 MG/ML  SOLN
80.0000 mL | Freq: Once | INTRAMUSCULAR | Status: AC | PRN
  Administered 2023-05-26: 80 mL via INTRAVENOUS

## 2023-05-26 MED ORDER — SODIUM CHLORIDE 0.9 % IV BOLUS
1000.0000 mL | Freq: Once | INTRAVENOUS | Status: AC
  Administered 2023-05-26: 1000 mL via INTRAVENOUS

## 2023-05-26 NOTE — ED Notes (Signed)
2 sets of blood cultures collected, on from each IV start

## 2023-05-26 NOTE — ED Triage Notes (Signed)
Patient has been weak all weak and is not eating or drinking like normal. Family denied cough, N/V/D or change in urination.

## 2023-05-26 NOTE — Progress Notes (Signed)
Hospitalist Transfer Note:   Transferring facility: [facility] Requesting provider: Dr. Alona Bene (EDP at [facility]) Reason for transfer: admission for further evaluation and management of hypercalcemia, dehydration, concern for new lung malignancy.     89  year old male who presented to [facility] ED w/ his daughter complaining of generalized weakness over the course of the last week, over which time he said diminished appetite, resulting in diminished oral intake.  No known history of underlying malignancy.  No report of any associated chest pain or shortness of breath, nor any subjective fevers.  Was felt to be dehydrated in appearance upon arrival at the ED, with initial soft blood pressures, with initial systolic pressures in the 70s, subsequently increasing into the 1 teens following 1 L of IV fluid.  Now started on continuous IV fluids.  Labs were notable for elevated calcium level of 13.3.  CBC notable for no leukocytosis.  Imaging notable for the following: While chest x-ray was preliminarily suggestive of potential underlying pneumonia, ensuing CT chest showed no evidence of infiltrate, but rather did show evidence to suggest new lung mass concerning for malignancy with some reported evidence of metastasis.  He was reported to be slightly somnolent upon arrival, but his improvement in his mental status with interval IV fluids.   Subsequently, I accepted this patient for transfer for inpatient admission to a med/tele bed at [facility] long for further work-up and management of the above .       Nursing staff, Please call [facility] Admits & Consults System-Wide number on Amion [phone]) as soon as patient's arrival, so appropriate admitting provider can evaluate the pt.     Newton Pigg, DO Hospitalist

## 2023-05-26 NOTE — ED Notes (Signed)
[facility] called for transport. 

## 2023-05-26 NOTE — ED Triage Notes (Signed)
Patient B/P 72/36 in triage. Patient is very diaphoretic.

## 2023-05-26 NOTE — ED Provider Notes (Signed)
Emergency Department Provider Note   I have reviewed the triage vital signs and the nursing notes.   HISTORY  Chief Complaint Weakness   HPI Gregory Butler is a 89 y.o. male has history of diabetes, hypertension, hyperlipidemia presents to the emergency department with generalized weakness and poor appetite.  Symptoms been ongoing for the past 7 days.  He arrives with his daughter at bedside who states she has been making him food but he has not felt like eating.  He has become increasingly weak to the point where they decided to bring him in today.  Had not noticed fever.  No vomiting or diarrhea.  Daughter denies any recent falls. Daughter believes that he has an Afib history but notes that his Cardiologist is at [facility]    Past Medical History:  Diagnosis Date   Aneurysm artery, iliac (HCC)    Arthritis    Diabetes mellitus without complication (HCC)    GERD (gastroesophageal reflux disease)    Hyperlipemia    Hypertension     Review of Systems  Constitutional: No fever/chills Cardiovascular: Denies chest pain. Respiratory: Denies shortness of breath. Gastrointestinal: No abdominal pain.  No nausea, no vomiting.  No diarrhea.   Skin: Negative for rash. Neurological: Negative for headaches.  ____________________________________________   PHYSICAL EXAM:  VITAL SIGNS: ED Triage Vitals  Encounter Vitals Group     BP 05/26/23 1733 (!) 72/36     Pulse Rate 05/26/23 1733 62     Resp 05/26/23 1733 18     Temp 05/26/23 1740 (!) 96.9 F (36.1 C)     Temp Source 05/26/23 1740 Axillary     SpO2 05/26/23 1736 96 %     Weight 05/26/23 1725 216 lb 0.8 oz (98 kg)     Height 05/26/23 1725 5\' 8"  (1.727 m)   Constitutional: Alert but appears weak and fatigued.  Eyes: Conjunctivae are normal. PERRL.  Head: Atraumatic. Nose: No congestion/rhinnorhea. Mouth/Throat: Mucous membranes are very dry.  Oropharynx non-erythematous. Neck: No stridor.  Cardiovascular:  Normal rate, regular rhythm. Good peripheral circulation. Grossly normal heart sounds.   Respiratory: Normal respiratory effort.  No retractions. Lungs CTAB. Gastrointestinal: Soft and nontender. No distention.  Musculoskeletal: No gross deformities of extremities. Neurologic:  Normal speech and language. Skin:  Skin is warm, dry and intact. No rash noted.   ____________________________________________   LABS (all labs ordered are listed, but only abnormal results are displayed)  Labs Reviewed  COMPREHENSIVE METABOLIC PANEL - Abnormal; Notable for the following components:      Result Value   Sodium 132 (*)    Glucose, Bld 142 (*)    Creatinine, Ser 1.51 (*)    Calcium 13.3 (*)    Albumin 2.9 (*)    GFR, Estimated 44 (*)    All other components within normal limits  LACTIC ACID, PLASMA - Abnormal; Notable for the following components:   Lactic Acid, Venous 2.3 (*)    All other components within normal limits  CBC WITH DIFFERENTIAL/PLATELET - Abnormal; Notable for the following components:   Hemoglobin 12.9 (*)    All other components within normal limits  URINALYSIS, W/ REFLEX TO CULTURE (INFECTION SUSPECTED) - Abnormal; Notable for the following components:   Glucose, UA >=500 (*)    Protein, ur 30 (*)    All other components within normal limits  CULTURE, BLOOD (ROUTINE X 2)  CULTURE, BLOOD (ROUTINE X 2)  LACTIC ACID, PLASMA  PROTIME-INR  TROPONIN I (HIGH SENSITIVITY)  TROPONIN I (HIGH SENSITIVITY)   ____________________________________________  EKG   EKG Interpretation Date/Time:  Saturday May 26 2023 17:34:53 EDT Ventricular Rate:  63 PR Interval:    QRS Duration:  103 QT Interval:  409 QTC Calculation: 399 R Axis:   26  Text Interpretation: Atrial fibrillation Abnormal R-wave progression, early transition Minimal ST depression, inferior leads Confirmed by Alona Bene [phone]) on 05/26/2023 5:40:53 PM         ____________________________________________  RADIOLOGY  CT CHEST ABDOMEN PELVIS W CONTRAST  Result Date: 05/26/2023 CLINICAL DATA:  Sepsis protocol.  Altered mental status EXAM: CT CHEST, ABDOMEN, AND PELVIS WITH CONTRAST TECHNIQUE: Multidetector CT imaging of the chest, abdomen and pelvis was performed following the standard protocol during bolus administration of intravenous contrast. RADIATION DOSE REDUCTION: This exam was performed according to the departmental dose-optimization program which includes automated exposure control, adjustment of the mA and/or kV according to patient size and/or use of iterative reconstruction technique. CONTRAST:  80mL OMNIPAQUE IOHEXOL 300 MG/ML  SOLN COMPARISON:  Chest radiograph 05/26/2023; CT 06/18/2019 and report from CT 10/24/2014 FINDINGS: CT CHEST FINDINGS Cardiovascular: Coronary artery and aortic atherosclerotic calcification. No aortic aneurysm or dissection. No pericardial effusion. Mediastinum/Nodes: Unremarkable trachea and esophagus. Mediastinal lymphadenopathy with some lymph node calcifications. The largest is a 1.4 cm subcarinal node on series 2/image 34. Lungs/Pleura: Cavitary mass in the right upper lobe measures 4.5 x 3.3 cm on series 4/image 70 advanced paraseptal and centrilobular emphysema. Peripheral reticular and ground-glass opacities compatible with interstitial lung disease. No pleural effusion or pneumothorax. Musculoskeletal: No acute fracture or destructive osseous lesion. CT ABDOMEN PELVIS FINDINGS Hepatobiliary: New irregular hypoattenuating area in the anterior left hepatic lobe compatible with metastasis measuring 5.6 x 6.5 cm. Additional cystic lesions in the left hepatic lobe are stable and compatible with cysts. Cholecystectomy. No biliary dilation. Pancreas: Unremarkable. Spleen: Unremarkable. Adrenals/Urinary Tract: Stable adrenal glands. Low-attenuation lesions in the kidneys are statistically likely to represent cysts. No  follow-up is required. Interval enlargement of a 2.4 cm intermediate density round lesion off the anterior upper pole of the right kidney on series 2/image 62. Additional new or enlarged intermediate density exophytic lesion off the posterior right kidney on series 2/image 76. These may represent hemorrhagic or proteinaceous cyst however indeterminate. No urinary calculi or hydronephrosis. Unremarkable bladder. Stomach/Bowel: Normal caliber large and small bowel. No bowel wall thickening. Normal appendix. Stomach is within normal limits. Vascular/Lymphatic: Advanced aortic atherosclerotic calcification. No abdominal or pelvic lymphadenopathy. Reproductive: Enlarged prostate. Other: No free intraperitoneal fluid or air. Musculoskeletal: Thoracolumbar spondylosis. No acute fracture or destructive osseous lesion. IMPRESSION: 1. Findings compatible with right upper lobe primary lung malignancy with mediastinal nodal and hepatic metastases. 2. Interval enlargement of intermediate density lesions off the right kidney. These may represent hemorrhagic or proteinaceous cyst however are indeterminate. Continued attention on follow-up. Aortic Atherosclerosis (ICD10-I70.0) and Emphysema (ICD10-J43.9). Electronically Signed   By: Minerva Fester M.D.   On: 05/26/2023 20:03   CT Head Wo Contrast  Result Date: 05/26/2023 CLINICAL DATA:  Altered mental status, sepsis EXAM: CT HEAD WITHOUT CONTRAST TECHNIQUE: Contiguous axial images were obtained from the base of the skull through the vertex without intravenous contrast. RADIATION DOSE REDUCTION: This exam was performed according to the departmental dose-optimization program which includes automated exposure control, adjustment of the mA and/or kV according to patient size and/or use of iterative reconstruction technique. COMPARISON:  None Available. FINDINGS: Brain: Normal anatomic configuration. Parenchymal volume loss is commensurate with the patient's age. No abnormal  intra  or extra-axial mass lesion or fluid collection. No abnormal mass effect or midline shift. No evidence of acute intracranial hemorrhage or infarct. Ventricular size is normal. Cerebellum unremarkable. Vascular: No asymmetric hyperdense vasculature at the skull base. Moderate atherosclerotic calcification within the carotid siphons Skull: Intact Sinuses/Orbits: Paranasal sinuses are clear. Orbits are unremarkable. Other: Mastoid air cells and middle ear cavities are clear. IMPRESSION: 1. No acute intracranial hemorrhage or infarct. 2. Age related volume loss. 3. Intracranial atherosclerosis. Electronically Signed   By: Helyn Numbers M.D.   On: 05/26/2023 19:46   DG Chest Portable 1 View  Result Date: 05/26/2023 CLINICAL DATA:  Weakness. EXAM: PORTABLE CHEST 1 VIEW COMPARISON:  October 23, 2014 FINDINGS: Normal cardiac silhouette. Tortuosity of the aorta. Prominence of the bilateral hilar regions. Airspace consolidation versus pulmonary nodule in the right mid thorax. Ground-glass airspace consolidation in the left lower lobe. IMPRESSION: 1. Airspace consolidation versus pulmonary nodule in the right mid thorax. 2. Ground-glass airspace consolidation in the left lower lobe. 3. Prominence of the bilateral hilar regions. 4. This may represent multifocal pneumonia, septic emboli or pulmonary masses. Evaluation with CT of the chest may be considered. Electronically Signed   By: Ted Mcalpine M.D.   On: 05/26/2023 18:37    ____________________________________________   PROCEDURES  Procedure(s) performed:   Procedures  CRITICAL CARE Performed by: Maia Plan Total critical care time: 35 minutes Critical care time was exclusive of separately billable procedures and treating other patients. Critical care was necessary to treat or prevent imminent or life-threatening deterioration. Critical care was time spent personally by me on the following activities: development of treatment plan with patient  and/or surrogate as well as nursing, discussions with consultants, evaluation of patient's response to treatment, examination of patient, obtaining history from patient or surrogate, ordering and performing treatments and interventions, ordering and review of laboratory studies, ordering and review of radiographic studies, pulse oximetry and re-evaluation of patient's condition.  Alona Bene, MD Emergency Medicine  ____________________________________________   INITIAL IMPRESSION / ASSESSMENT AND PLAN / ED COURSE  Pertinent labs & imaging results that were available during my care of the patient were reviewed by me and considered in my medical decision making (see chart for details).   This patient is Presenting for Evaluation of AMS, which does require a range of treatment options, and is a complaint that involves a high risk of morbidity and mortality.  The Differential Diagnoses includes but is not exclusive to alcohol, illicit or prescription medications, intracranial pathology such as stroke, intracerebral hemorrhage, fever or infectious causes including sepsis, hypoxemia, uremia, trauma, endocrine related disorders such as diabetes, hypoglycemia, thyroid-related diseases, etc.   Critical Interventions-    Medications  lactated ringers infusion ( Intravenous New Bag/Given 05/26/23 2046)  sodium chloride 0.9 % bolus 1,000 mL (0 mLs Intravenous Stopped 05/26/23 2022)  iohexol (OMNIPAQUE) 300 MG/ML solution 80 mL (80 mLs Intravenous Contrast Given 05/26/23 1906)    Reassessment after intervention: hypotension improved.    I did obtain Additional Historical Information from daughter at bedside.  I decided to review pertinent External Data, and in summary no recent ED visits   Clinical Laboratory Tests Ordered, included UA without infection.  Patient with significant hypercalcemia to 13.3.  Mild renal insufficiency 1.51.  Troponin normal.  Gassett initially mildly elevated but  downtrending.  Doubt sepsis.   Radiologic Tests Ordered, included CXR, CT head, CT chest/abd/pelvis. I independently interpreted the images and agree with radiology interpretation.   Cardiac Monitor  Tracing which shows A fib - rate controlled.    Social Determinants of Health Risk patient is a non-smoker.   Consult complete with [facility]. Plan for admit for hypercalcemia and continued hydration.    Medical Decision Making: Summary:  Patient presents emergency department for evaluation of generalized weakness with very poor appetite over the past 7 days.  Arrives with low temp and blood pressure. Lactic slightly elevated. Doubt sepsis. Likely secondary to acute dehydration, acute kidney injury, electrolyte disturbance, etc. Initiating broad workup at this time. BP improved with IVF to this point.   Reevaluation with update and discussion with patient and family at bedside.  Discussed CT findings in the lung and plan for admit.  They are agreement with plan.  Patient's presentation is most consistent with acute presentation with potential threat to life or bodily function.   Disposition: discharge  ____________________________________________  FINAL CLINICAL IMPRESSION(S) / ED DIAGNOSES  Final diagnoses:  Hypovolemic shock (HCC)  Hypercalcemia  Mass of lung    Note:  This document was prepared using Dragon voice recognition software and may include unintentional dictation errors.  Alona Bene, MD, [facility] Emergency Medicine    Sueellen Kayes, Arlyss Repress, MD 05/26/23 2105

## 2023-05-27 ENCOUNTER — Other Ambulatory Visit (HOSPITAL_COMMUNITY): Payer: Medicare Other

## 2023-05-27 ENCOUNTER — Inpatient Hospital Stay (HOSPITAL_COMMUNITY): Payer: Medicare Other

## 2023-05-27 ENCOUNTER — Encounter (HOSPITAL_COMMUNITY): Payer: Self-pay | Admitting: Internal Medicine

## 2023-05-27 DIAGNOSIS — R571 Hypovolemic shock: Secondary | ICD-10-CM

## 2023-05-27 DIAGNOSIS — Z515 Encounter for palliative care: Secondary | ICD-10-CM | POA: Diagnosis not present

## 2023-05-27 DIAGNOSIS — E860 Dehydration: Secondary | ICD-10-CM | POA: Diagnosis present

## 2023-05-27 DIAGNOSIS — C787 Secondary malignant neoplasm of liver and intrahepatic bile duct: Secondary | ICD-10-CM | POA: Diagnosis present

## 2023-05-27 DIAGNOSIS — E872 Acidosis, unspecified: Secondary | ICD-10-CM | POA: Diagnosis present

## 2023-05-27 DIAGNOSIS — R1310 Dysphagia, unspecified: Secondary | ICD-10-CM | POA: Diagnosis present

## 2023-05-27 DIAGNOSIS — Z79899 Other long term (current) drug therapy: Secondary | ICD-10-CM | POA: Diagnosis not present

## 2023-05-27 DIAGNOSIS — C3411 Malignant neoplasm of upper lobe, right bronchus or lung: Secondary | ICD-10-CM | POA: Diagnosis present

## 2023-05-27 DIAGNOSIS — I959 Hypotension, unspecified: Secondary | ICD-10-CM | POA: Diagnosis present

## 2023-05-27 DIAGNOSIS — R918 Other nonspecific abnormal finding of lung field: Secondary | ICD-10-CM

## 2023-05-27 DIAGNOSIS — K219 Gastro-esophageal reflux disease without esophagitis: Secondary | ICD-10-CM | POA: Diagnosis present

## 2023-05-27 DIAGNOSIS — G9341 Metabolic encephalopathy: Secondary | ICD-10-CM | POA: Diagnosis present

## 2023-05-27 DIAGNOSIS — Z87891 Personal history of nicotine dependence: Secondary | ICD-10-CM | POA: Diagnosis not present

## 2023-05-27 DIAGNOSIS — C3490 Malignant neoplasm of unspecified part of unspecified bronchus or lung: Secondary | ICD-10-CM

## 2023-05-27 DIAGNOSIS — Z66 Do not resuscitate: Secondary | ICD-10-CM | POA: Diagnosis present

## 2023-05-27 DIAGNOSIS — I4891 Unspecified atrial fibrillation: Secondary | ICD-10-CM

## 2023-05-27 DIAGNOSIS — Z7982 Long term (current) use of aspirin: Secondary | ICD-10-CM | POA: Diagnosis not present

## 2023-05-27 DIAGNOSIS — I10 Essential (primary) hypertension: Secondary | ICD-10-CM | POA: Diagnosis present

## 2023-05-27 DIAGNOSIS — C771 Secondary and unspecified malignant neoplasm of intrathoracic lymph nodes: Secondary | ICD-10-CM | POA: Diagnosis present

## 2023-05-27 DIAGNOSIS — N179 Acute kidney failure, unspecified: Secondary | ICD-10-CM | POA: Diagnosis present

## 2023-05-27 DIAGNOSIS — R9431 Abnormal electrocardiogram [ECG] [EKG]: Secondary | ICD-10-CM

## 2023-05-27 DIAGNOSIS — E785 Hyperlipidemia, unspecified: Secondary | ICD-10-CM | POA: Diagnosis present

## 2023-05-27 DIAGNOSIS — E1165 Type 2 diabetes mellitus with hyperglycemia: Secondary | ICD-10-CM | POA: Diagnosis present

## 2023-05-27 DIAGNOSIS — Z6832 Body mass index (BMI) 32.0-32.9, adult: Secondary | ICD-10-CM | POA: Diagnosis not present

## 2023-05-27 DIAGNOSIS — E876 Hypokalemia: Secondary | ICD-10-CM | POA: Diagnosis present

## 2023-05-27 DIAGNOSIS — R739 Hyperglycemia, unspecified: Secondary | ICD-10-CM

## 2023-05-27 LAB — COMPREHENSIVE METABOLIC PANEL
ALT: 17 U/L (ref 0–44)
AST: 21 U/L (ref 15–41)
Albumin: 2.9 g/dL — ABNORMAL LOW (ref 3.5–5.0)
Alkaline Phosphatase: 76 U/L (ref 38–126)
Anion gap: 9 (ref 5–15)
BUN: 19 mg/dL (ref 8–23)
CO2: 24 mmol/L (ref 22–32)
Calcium: 12.8 mg/dL — ABNORMAL HIGH (ref 8.9–10.3)
Chloride: 102 mmol/L (ref 98–111)
Creatinine, Ser: 1.1 mg/dL (ref 0.61–1.24)
GFR, Estimated: >60 mL/min (ref >60)
Glucose, Bld: 132 mg/dL — ABNORMAL HIGH (ref 70–99)
Potassium: 4.4 mmol/L (ref 3.5–5.1)
Sodium: 135 mmol/L (ref 135–145)
Total Bilirubin: 0.9 mg/dL (ref 0.3–1.2)
Total Protein: 7.2 g/dL (ref 6.5–8.1)

## 2023-05-27 LAB — CBC WITH DIFFERENTIAL/PLATELET
Abs Immature Granulocytes: 0.03 10*3/uL (ref 0.00–0.07)
Basophils Absolute: 0 10*3/uL (ref 0.0–0.1)
Basophils Relative: 0 %
Eosinophils Absolute: 0.1 10*3/uL (ref 0.0–0.5)
Eosinophils Relative: 1 %
HCT: 39.6 % (ref 39.0–52.0)
Hemoglobin: 12.4 g/dL — ABNORMAL LOW (ref 13.0–17.0)
Immature Granulocytes: 0 %
Lymphocytes Relative: 19 %
Lymphs Abs: 1.5 10*3/uL (ref 0.7–4.0)
MCH: 28.2 pg (ref 26.0–34.0)
MCHC: 31.3 g/dL (ref 30.0–36.0)
MCV: 90 fL (ref 80.0–100.0)
Monocytes Absolute: 0.6 10*3/uL (ref 0.1–1.0)
Monocytes Relative: 8 %
Neutro Abs: 5.7 10*3/uL (ref 1.7–7.7)
Neutrophils Relative %: 72 %
Platelets: 340 10*3/uL (ref 150–400)
RBC: 4.4 MIL/uL (ref 4.22–5.81)
RDW: 14.8 % (ref 11.5–15.5)
WBC: 7.9 10*3/uL (ref 4.0–10.5)
nRBC: 0 % (ref 0.0–0.2)

## 2023-05-27 LAB — ECHOCARDIOGRAM COMPLETE
AR max vel: 1.11 cm2
AV Area VTI: 1.09 cm2
AV Area mean vel: 1.09 cm2
AV Mean grad: 27 mmHg
AV Peak grad: 49.3 mmHg
Ao pk vel: 3.51 m/s
Area-P 1/2: 3.21 cm2
Height: 68 in
S' Lateral: 2.2 cm
Weight: 3456.81 oz

## 2023-05-27 MED ORDER — ONDANSETRON HCL 4 MG/2ML IJ SOLN: 4.0000 mg | Freq: Four times a day (QID) | INTRAMUSCULAR | Status: DC | PRN

## 2023-05-27 MED ORDER — ENOXAPARIN SODIUM 40 MG/0.4ML IJ SOSY: 40.0000 mg | PREFILLED_SYRINGE | INTRAMUSCULAR | Status: DC

## 2023-05-27 MED ORDER — THIAMINE HCL 100 MG/ML IJ SOLN: 100.0000 mg | Freq: Every day | INTRAMUSCULAR | Status: DC

## 2023-05-27 MED ORDER — THIAMINE HCL 100 MG/ML IJ SOLN
500.0000 mg | Freq: Three times a day (TID) | INTRAVENOUS | Status: AC
  Administered 2023-05-27 – 2023-05-30 (×9): 500 mg via INTRAVENOUS
  Filled 2023-05-27 (×9): qty 5

## 2023-05-27 MED ORDER — CALCITONIN (SALMON) 200 UNIT/ML IJ SOLN
4.0000 [IU]/kg | INTRAMUSCULAR | Status: DC
  Administered 2023-05-27: 392 [IU] via SUBCUTANEOUS
  Filled 2023-05-27: qty 1.96

## 2023-05-27 MED ORDER — THIAMINE HCL 100 MG/ML IJ SOLN
250.0000 mg | Freq: Every day | INTRAVENOUS | Status: DC
  Administered 2023-05-30 – 2023-05-31 (×2): 250 mg via INTRAVENOUS
  Filled 2023-05-27 (×4): qty 2.5

## 2023-05-27 MED ORDER — ONDANSETRON HCL 4 MG PO TABS: 4.0000 mg | ORAL_TABLET | Freq: Four times a day (QID) | ORAL | Status: DC | PRN

## 2023-05-27 MED ORDER — SENNOSIDES-DOCUSATE SODIUM 8.6-50 MG PO TABS: 1.0000 | ORAL_TABLET | Freq: Every evening | ORAL | Status: DC | PRN

## 2023-05-27 MED ORDER — LORAZEPAM 2 MG/ML IJ SOLN
0.5000 mg | Freq: Once | INTRAMUSCULAR | Status: AC
  Administered 2023-05-27: 0.5 mg via INTRAVENOUS
  Filled 2023-05-27: qty 1

## 2023-05-27 MED ORDER — LORAZEPAM 2 MG/ML IJ SOLN
1.0000 mg | Freq: Once | INTRAMUSCULAR | Status: AC
  Administered 2023-05-27: 1 mg via INTRAVENOUS
  Filled 2023-05-27: qty 1

## 2023-05-27 MED ORDER — ZOLEDRONIC ACID 4 MG/100ML IV SOLN
4.0000 mg | Freq: Once | INTRAVENOUS | Status: AC
  Administered 2023-05-27: 4 mg via INTRAVENOUS
  Filled 2023-05-27: qty 100

## 2023-05-27 MED ORDER — HALOPERIDOL LACTATE 5 MG/ML IJ SOLN
5.0000 mg | Freq: Four times a day (QID) | INTRAMUSCULAR | Status: DC | PRN
  Administered 2023-05-27 – 2023-06-03 (×11): 5 mg via INTRAVENOUS
  Filled 2023-05-27 (×14): qty 1

## 2023-05-27 MED ORDER — SODIUM CHLORIDE 0.9 % IV SOLN: INTRAVENOUS | Status: DC

## 2023-05-27 MED ORDER — ACETAMINOPHEN 325 MG PO TABS
650.0000 mg | ORAL_TABLET | Freq: Four times a day (QID) | ORAL | Status: DC | PRN
  Administered 2023-06-02: 650 mg via ORAL
  Filled 2023-05-27: qty 2

## 2023-05-27 MED ORDER — ACETAMINOPHEN 650 MG RE SUPP: 650.0000 mg | Freq: Four times a day (QID) | RECTAL | Status: DC | PRN

## 2023-05-27 MED ORDER — HEPARIN SODIUM (PORCINE) 5000 UNIT/ML IJ SOLN
5000.0000 [IU] | Freq: Three times a day (TID) | INTRAMUSCULAR | Status: DC
  Administered 2023-05-27 – 2023-06-01 (×8): 5000 [IU] via SUBCUTANEOUS
  Filled 2023-05-27 (×11): qty 1

## 2023-05-27 NOTE — Plan of Care (Signed)
  Problem: Pain Managment: Goal: General experience of comfort will improve Outcome: Progressing   Problem: Skin Integrity: Goal: Risk for impaired skin integrity will decrease Outcome: Progressing   

## 2023-05-27 NOTE — Progress Notes (Signed)
  Echocardiogram 2D Echocardiogram has been performed.  Gregory Butler 05/27/2023, 3:43 PM

## 2023-05-27 NOTE — Plan of Care (Signed)
  Problem: Activity: Goal: Risk for activity intolerance will decrease Outcome: Progressing   Problem: Coping: Goal: Level of anxiety will decrease Outcome: Progressing   Problem: Pain Managment: Goal: General experience of comfort will improve 05/27/2023 0429 by Malachi Carl, RN Outcome: Progressing 05/27/2023 0244 by Malachi Carl, RN Outcome: Progressing   Problem: Skin Integrity: Goal: Risk for impaired skin integrity will decrease Outcome: Progressing

## 2023-05-27 NOTE — Progress Notes (Signed)
PROGRESS NOTE    Gregory Butler  ZOX:[id number] DOB: Mar 04, 1932 DOA: 05/26/2023 PCP: Chauncy Lean, PA-C  Chief Complaint  Patient presents with   Weakness    Brief Narrative:    89 year old male who is active at baseline.  He has [name] past medical history of atrial fibrillation, HTN and HLD.  Approximately Gregory Butler month ago patient was driving, got disoriented and lost.  He was found in [place] by police.  The family has stopped his driving.  Per family he has been on Essica Kiker decline since.  Over the past 2 weeks his appetite has declined, he picks at food.  He has had significant weight loss.  Per family he normally has Gregory Butler good appetite.  He has had some confusion, especially at night.  He now lays around all day, is forgetful, repetitious and listless.  Today he was pale and weak looking, they took him to the ER.  He has [name] remote history of tobacco use, having quit approximately 10 years ago.  He was taken to the ER patient became completely confused while in the ER.   In the ER blood work done revealed [name] calcium of 13.3.  CT chest shows [name] RUL cavitary mass 4.5 x 3.3 cm.  Hepatic metastasis also noted.  Interval enlargement of intermediate low-density lesions of the right kidney.  Presenting blood pressure 72/36.  Patient responsive to 1 L IV fluid bolus.   History provided by daughter [name] and son [name] who both are at bedside  Assessment & Plan:   Principal Problem:   Hypercalcemia Active Problems:   Atrial fibrillation (HCC)   Lactic acid acidosis   Lung cancer (HCC)   Metastasis to liver (HCC)   Hyperglycemia  Goals of Care Discussed with family, concern given current delirium and his recent abrupt decline that he probably wouldn't be great candidate for cancer treatment.  Discussed recommendation that based on my limited interaction with Mr. Molder, my recommendation would be to consider comfort based care.  [name], and [name] were on phone and were hopeful to see what sort of  improvement he has as we treat hypercalcemia and monitor him.  I think it's reasonable to see how he does day to day with IVF and supportive care.  Recommended to family that before committing to biopsy, we reevaluate based on how he's doing and how the biopsy would change our management (would we pursue cancer treatment?).    Acute Metabolic Encephalopathy This morning he's delirious, in restraints - received haldol this AM (maybe this is why he was so encephalopathic for me) At baseline, able to do some things for himself (get up, wash up, etc) - got lost Mirenda Baltazar month ago driving, so they stopped letting him drive.  Has been repeating himself more.  Eating less. Delirium precautions High dose thiamine given poor PO intake B12, folate, TSH Treat hypercalcemia  Hypotension  Lactic Acidosis Suspect related to hypovolemia with poor PO intake and hypercalcemia Improved Holding home norvasc and metoprolol and lasix  Hypercalcemia S/p zolendronic acid Will continue IVF Follow echo Follow PTH, PTHrp Suspect this is related to his malignancy  Metastatic Lung Cancer Presumed based on imaging with right upper lobe primary lung malignancy with mediastinal nodal and hepatic metastases Will reconsider biopsy based on how he does with supportive care IR considering biopsy on Thursday (after aspirin held x5 days)  Atrial Fibrillation Metoprolol on hold Echo  Hold anticoagulation for now  AKI Improving  Hyperglycemia Follow A1c  Dyslipidemia Crestor if  able to tolerate  Intermediate Density lesions off R Kidney Hemorrhagic or proteinaceous cysts?    DVT prophylaxis: heparin Code Status: full Family Communication: none Disposition:   Status is: Inpatient Remains inpatient appropriate because: continued need for inpatient care   Consultants:  IR  Procedures:  none  Antimicrobials:  Anti-infectives (From admission, onward)    None       Subjective: Agitated, delirious,  nonverbal   Objective: Vitals:   05/27/23 0030 05/27/23 0130 05/27/23 0224 05/27/23 0731  BP: (!) 132/91 117/73 134/68 (!) 155/66  Pulse:  73 86 98  Resp: 19 13 20 12   Temp:   (!) 97.5 F (36.4 C)   TempSrc:   Oral   SpO2:  96% 98%   Weight:      Height:        Intake/Output Summary (Last 24 hours) at 05/27/2023 1041 Last data filed at 05/27/2023 0830 Gross per 24 hour  Intake 1948.53 ml  Output 950 ml  Net 998.53 ml   Filed Weights   05/26/23 1725  Weight: 98 kg    Examination:  General exam: Appears agitated Respiratory system: unlabored Cardiovascular system: tachycardic, regular Gastrointestinal system: Abdomen is nondistended, soft and nontender. Central nervous system: agitated, doesn't say anything intelligible to me - fighting against restraints Extremities: no LEE    Data Reviewed: I have personally reviewed following labs and imaging studies  CBC: Recent Labs  Lab 05/26/23 1742 05/27/23 0354  WBC 8.0 7.9  NEUTROABS 5.6 5.7  HGB 12.9* 12.4*  HCT 39.2 39.6  MCV 87.1 90.0  PLT 399 340    Basic Metabolic Panel: Recent Labs  Lab 05/26/23 1742 05/27/23 0354  NA 132* 135  K 4.3 4.4  CL 99 102  CO2 23 24  GLUCOSE 142* 132*  BUN 23 19  CREATININE 1.51* 1.10  CALCIUM 13.3* 12.8*    GFR: Estimated Creatinine Clearance: 51.6 mL/min (by C-G formula based on SCr of 1.1 mg/dL).  Liver Function Tests: Recent Labs  Lab 05/26/23 1742 05/27/23 0354  AST 25 21  ALT 18 17  ALKPHOS 89 76  BILITOT 0.7 0.9  PROT 7.4 7.2  ALBUMIN 2.9* 2.9*    CBG: No results for input(s): "GLUCAP" in the last 168 hours.   Recent Results (from the past 240 hour(s))  Culture, blood (Routine x 2)     Status: None (Preliminary result)   Collection Time: 05/26/23  5:42 PM   Specimen: BLOOD  Result Value Ref Range Status   Specimen Description   Final    BLOOD RIGHT ANTECUBITAL Performed at [facility], [address] [place], [place] [address]     Special Requests   Final    BOTTLES DRAWN AEROBIC AND ANAEROBIC Blood Culture adequate volume Performed at [facility], [address] [place], [place] [address]    Culture   Final    NO GROWTH < 12 HOURS Performed at [facility], [address] [place], [place] [address]    Report Status PENDING  Incomplete  Culture, blood (Routine x 2)     Status: None (Preliminary result)   Collection Time: 05/26/23  5:45 PM   Specimen: BLOOD  Result Value Ref Range Status   Specimen Description   Final    BLOOD BLOOD LEFT FOREARM Performed at [facility], [address], [place], [place] [address]    Special Requests   Final    BOTTLES DRAWN  AEROBIC AND ANAEROBIC Blood Culture adequate volume Performed at [facility], [address] [place], [place] [address]    Culture   Final    NO GROWTH < 12 HOURS Performed at [facility], [address], [place], [place] [address]    Report Status PENDING  Incomplete         Radiology Studies: CT CHEST ABDOMEN PELVIS W CONTRAST  Result Date: 05/26/2023 CLINICAL DATA:  Sepsis protocol.  Altered mental status EXAM: CT CHEST, ABDOMEN, AND PELVIS WITH CONTRAST TECHNIQUE: Multidetector CT imaging of the chest, abdomen and pelvis was performed following the standard protocol during bolus administration of intravenous contrast. RADIATION DOSE REDUCTION: This exam was performed according to the departmental dose-optimization program which includes automated exposure control, adjustment of the mA and/or kV according to patient size and/or use of iterative reconstruction technique. CONTRAST:  80mL OMNIPAQUE IOHEXOL 300 MG/ML  SOLN COMPARISON:  Chest radiograph 05/26/2023; CT 06/18/2019 and report from CT 10/24/2014 FINDINGS: CT CHEST FINDINGS Cardiovascular: Coronary artery and aortic atherosclerotic calcification. No aortic aneurysm or dissection. No pericardial effusion. Mediastinum/Nodes: Unremarkable  trachea and esophagus. Mediastinal lymphadenopathy with some lymph node calcifications. The largest is Quetzal Meany 1.4 cm subcarinal node on series 2/image 34. Lungs/Pleura: Cavitary mass in the right upper lobe measures 4.5 x 3.3 cm on series 4/image 70 advanced paraseptal and centrilobular emphysema. Peripheral reticular and ground-glass opacities compatible with interstitial lung disease. No pleural effusion or pneumothorax. Musculoskeletal: No acute fracture or destructive osseous lesion. CT ABDOMEN PELVIS FINDINGS Hepatobiliary: New irregular hypoattenuating area in the anterior left hepatic lobe compatible with metastasis measuring 5.6 x 6.5 cm. Additional cystic lesions in the left hepatic lobe are stable and compatible with cysts. Cholecystectomy. No biliary dilation. Pancreas: Unremarkable. Spleen: Unremarkable. Adrenals/Urinary Tract: Stable adrenal glands. Low-attenuation lesions in the kidneys are statistically likely to represent cysts. No follow-up is required. Interval enlargement of Shaakira Borrero 2.4 cm intermediate density round lesion off the anterior upper pole of the right kidney on series 2/image 62. Additional new or enlarged intermediate density exophytic lesion off the posterior right kidney on series 2/image 76. These may represent hemorrhagic or proteinaceous cyst however indeterminate. No urinary calculi or hydronephrosis. Unremarkable bladder. Stomach/Bowel: Normal caliber large and small bowel. No bowel wall thickening. Normal appendix. Stomach is within normal limits. Vascular/Lymphatic: Advanced aortic atherosclerotic calcification. No abdominal or pelvic lymphadenopathy. Reproductive: Enlarged prostate. Other: No free intraperitoneal fluid or air. Musculoskeletal: Thoracolumbar spondylosis. No acute fracture or destructive osseous lesion. IMPRESSION: 1. Findings compatible with right upper lobe primary lung malignancy with mediastinal nodal and hepatic metastases. 2. Interval enlargement of intermediate  density lesions off the right kidney. These may represent hemorrhagic or proteinaceous cyst however are indeterminate. Continued attention on follow-up. Aortic Atherosclerosis (ICD10-I70.0) and Emphysema (ICD10-J43.9). Electronically Signed   By: Minerva Fester M.D.   On: 05/26/2023 20:03   CT Head Wo Contrast  Result Date: 05/26/2023 CLINICAL DATA:  Altered mental status, sepsis EXAM: CT HEAD WITHOUT CONTRAST TECHNIQUE: Contiguous axial images were obtained from the base of the skull through the vertex without intravenous contrast. RADIATION DOSE REDUCTION: This exam was performed according to the departmental dose-optimization program which includes automated exposure control, adjustment of the mA and/or kV according to patient size and/or use of iterative reconstruction technique. COMPARISON:  None Available. FINDINGS: Brain: Normal anatomic configuration. Parenchymal volume loss is commensurate with the patient's age. No abnormal intra or extra-axial mass lesion or fluid collection. No abnormal mass effect or midline  shift. No evidence of acute intracranial hemorrhage or infarct. Ventricular size is normal. Cerebellum unremarkable. Vascular: No asymmetric hyperdense vasculature at the skull base. Moderate atherosclerotic calcification within the carotid siphons Skull: Intact Sinuses/Orbits: Paranasal sinuses are clear. Orbits are unremarkable. Other: Mastoid air cells and middle ear cavities are clear. IMPRESSION: 1. No acute intracranial hemorrhage or infarct. 2. Age related volume loss. 3. Intracranial atherosclerosis. Electronically Signed   By: Helyn Numbers M.D.   On: 05/26/2023 19:46   DG Chest Portable 1 View  Result Date: 05/26/2023 CLINICAL DATA:  Weakness. EXAM: PORTABLE CHEST 1 VIEW COMPARISON:  October 23, 2014 FINDINGS: Normal cardiac silhouette. Tortuosity of the aorta. Prominence of the bilateral hilar regions. Airspace consolidation versus pulmonary nodule in the right mid thorax.  Ground-glass airspace consolidation in the left lower lobe. IMPRESSION: 1. Airspace consolidation versus pulmonary nodule in the right mid thorax. 2. Ground-glass airspace consolidation in the left lower lobe. 3. Prominence of the bilateral hilar regions. 4. This may represent multifocal pneumonia, septic emboli or pulmonary masses. Evaluation with CT of the chest may be considered. Electronically Signed   By: Ted Mcalpine M.D.   On: 05/26/2023 18:37        Scheduled Meds:  heparin  5,000 Units Subcutaneous Q8H   Continuous Infusions:  sodium chloride       LOS: 0 days    Time spent: over 30 min    Lacretia Nicks, MD [facility]   To contact the attending provider between 7A-7P or the covering provider during after hours 7P-7A, please log into the web site [email or web address] and access using universal [place] password for that web site. If you do not have the password, please call the hospital operator.  05/27/2023, 10:41 AM

## 2023-05-27 NOTE — Plan of Care (Signed)
°  Problem: Coping: °Goal: Level of anxiety will decrease °Outcome: Progressing °  °

## 2023-05-27 NOTE — TOC Progression Note (Signed)
Transition of Care [facility]) - Progression Note    Patient Details  Name: Gregory Butler MRN: [id number] Date of Birth: [birth year]  Transition of Care [facility]) CM/SW Contact  Coralyn Helling, [place] Phone Number: 05/27/2023, 12:18 PM  Clinical Narrative:    Transition of Care [facility]) - Inpatient Brief Assessment   Patient Details  Name: Gregory Butler MRN: [id number] Date of Birth: [birth year]  Transition of Care [facility]) CM/SW Contact:    Coralyn Helling, LCSW Phone Number: 05/27/2023, 12:19 PM   Clinical Narrative: Screening complete. TOC will follow for disposition.    Transition of Care Asessment: Insurance and Status: Insurance coverage has been reviewed Patient has primary care physician: Yes Home environment has been reviewed: From home with children Prior level of function:: independent/active at baseline Prior/Current Home Services: No current home services Social Determinants of Health Reivew: SDOH reviewed needs interventions Readmission risk has been reviewed: No (yellow) Transition of care needs: transition of care needs identified, TOC will continue to follow         Expected Discharge Plan and Services                                               Social Determinants of Health (SDOH) Interventions SDOH Screenings   Food Insecurity: No Food Insecurity (05/27/2023)  Housing: Low Risk  (05/27/2023)  Transportation Needs: No Transportation Needs (05/27/2023)  Utilities: Not At Risk (05/27/2023)  Tobacco Use: Medium Risk (05/27/2023)    Readmission Risk Interventions     No data to display

## 2023-05-27 NOTE — Progress Notes (Signed)
Patient became agitated, trying to removed tubing, and leave the bed. Restraints order obtained and applied. RN cannot reach patient's daughter, leave the message.

## 2023-05-27 NOTE — Consult Note (Signed)
Chief Complaint: Concern for metastatic cancer  Referring Provider(s): A. Lacretia Nicks  Supervising Physician: Irish Lack  Patient Status: [facility] - In-pt  History of Present Illness: Gregory Butler is a 89 y.o. male with medical issues including Afib, HTN, and HLD.  He has been active for his age but about a month ago he was driving and got lost.   His family reports decline since that time. They report appetite and weight loss and worsening confusion.  ED workup showed calcium of 13.3.    CT chest shows a RUL cavitary mass 4.5 x 3.3 cm.  Hepatic metastasis also noted.  Interval enlargement of intermediate low-density lesions of the right kidney.   We are asked to evaluate patient for a biopsy.  Currently Patient is Full Code  Past Medical History:  Diagnosis Date   Aneurysm artery, iliac (HCC)    Arthritis    Diabetes mellitus without complication (HCC)    GERD (gastroesophageal reflux disease)    Hyperlipemia    Hypertension     Past Surgical History:  Procedure Laterality Date   CHOLECYSTECTOMY     TUMOR EXCISION Right    lower right abdominal    Allergies: Patient has no known allergies.  Medications: Prior to Admission medications   Medication Sig Start Date End Date Taking? Authorizing Provider  amLODipine (NORVASC) 5 MG tablet Take 5 mg by mouth daily.    [provider]  Apoaequorin (PREVAGEN EXTRA STRENGTH) 20 MG CAPS Take 20 mg by mouth daily.    [provider]  aspirin 81 MG tablet Take 81 mg by mouth daily.    [provider]  furosemide (LASIX) 20 MG tablet Take 20 mg by mouth daily.    [provider]  metoprolol tartrate (LOPRESSOR) 100 MG tablet Take by mouth. 09/19/15   [provider]  rosuvastatin (CRESTOR) 20 MG tablet Take 20 mg by mouth daily.    [provider]     Family History  Problem Relation Age of Onset   Heart disease Mother    Heart disease Father     Social  History   Socioeconomic History   Marital status: Single    Spouse name: Not on file   Number of children: Not on file   Years of education: Not on file   Highest education level: Not on file  Occupational History   Not on file  Tobacco Use   Smoking status: Former    Current packs/day: 0.00    Types: Cigarettes    Quit date: 11/29/2011    Years since quitting: 11.4   Smokeless tobacco: Former    Quit date: 11/28/1953  Substance and Sexual Activity   Alcohol use: No    Alcohol/week: 0.0 standard drinks of alcohol   Drug use: No   Sexual activity: Not on file  Other Topics Concern   Not on file  Social History Narrative   Not on file   Social Determinants of Health   Financial Resource Strain: Not on file  Food Insecurity: No Food Insecurity (05/27/2023)   Hunger Vital Sign    Worried About Running Out of Food in the Last Year: Never true    Ran Out of Food in the Last Year: Never true  Transportation Needs: No Transportation Needs (05/27/2023)   PRAPARE - Administrator, Civil Service (Medical): No    Lack of Transportation (Non-Medical): No  Physical Activity: Not on file  Stress: Not on  file  Social Connections: Not on file   Review of Systems  Unable to perform ROS: Mental status change    Vital Signs: BP (!) 155/66 (BP Location: Left Arm)   Pulse 98   Temp (!) 97.5 F (36.4 C) (Oral)   Resp 12   Ht 5\' 8"  (1.727 m)   Wt 216 lb 0.8 oz (98 kg)   SpO2 98%   BMI 32.85 kg/m   Advance Care Plan: The advanced care place/surrogate decision maker was discussed at the time of visit and the patient did not wish to discuss or was not able to name a surrogate decision maker or provide an advance care plan.  Physical Exam Constitutional:      Appearance: He is ill-appearing.     Comments: Non-verbal, eyes open, mouth open, does not respond or follow commands at all.  Cardiovascular:     Rate and Rhythm: Regular rhythm. Tachycardia present.  Pulmonary:      Effort: Pulmonary effort is normal.  Abdominal:     Palpations: Abdomen is soft.  Skin:    General: Skin is warm.  Neurological:     Comments: Non-verbal     Imaging: CT CHEST ABDOMEN PELVIS W CONTRAST  Result Date: 05/26/2023 CLINICAL DATA:  Sepsis protocol.  Altered mental status EXAM: CT CHEST, ABDOMEN, AND PELVIS WITH CONTRAST TECHNIQUE: Multidetector CT imaging of the chest, abdomen and pelvis was performed following the standard protocol during bolus administration of intravenous contrast. RADIATION DOSE REDUCTION: This exam was performed according to the departmental dose-optimization program which includes automated exposure control, adjustment of the mA and/or kV according to patient size and/or use of iterative reconstruction technique. CONTRAST:  80mL OMNIPAQUE IOHEXOL 300 MG/ML  SOLN COMPARISON:  Chest radiograph 05/26/2023; CT 06/18/2019 and report from CT 10/24/2014 FINDINGS: CT CHEST FINDINGS Cardiovascular: Coronary artery and aortic atherosclerotic calcification. No aortic aneurysm or dissection. No pericardial effusion. Mediastinum/Nodes: Unremarkable trachea and esophagus. Mediastinal lymphadenopathy with some lymph node calcifications. The largest is a 1.4 cm subcarinal node on series 2/image 34. Lungs/Pleura: Cavitary mass in the right upper lobe measures 4.5 x 3.3 cm on series 4/image 70 advanced paraseptal and centrilobular emphysema. Peripheral reticular and ground-glass opacities compatible with interstitial lung disease. No pleural effusion or pneumothorax. Musculoskeletal: No acute fracture or destructive osseous lesion. CT ABDOMEN PELVIS FINDINGS Hepatobiliary: New irregular hypoattenuating area in the anterior left hepatic lobe compatible with metastasis measuring 5.6 x 6.5 cm. Additional cystic lesions in the left hepatic lobe are stable and compatible with cysts. Cholecystectomy. No biliary dilation. Pancreas: Unremarkable. Spleen: Unremarkable. Adrenals/Urinary Tract:  Stable adrenal glands. Low-attenuation lesions in the kidneys are statistically likely to represent cysts. No follow-up is required. Interval enlargement of a 2.4 cm intermediate density round lesion off the anterior upper pole of the right kidney on series 2/image 62. Additional new or enlarged intermediate density exophytic lesion off the posterior right kidney on series 2/image 76. These may represent hemorrhagic or proteinaceous cyst however indeterminate. No urinary calculi or hydronephrosis. Unremarkable bladder. Stomach/Bowel: Normal caliber large and small bowel. No bowel wall thickening. Normal appendix. Stomach is within normal limits. Vascular/Lymphatic: Advanced aortic atherosclerotic calcification. No abdominal or pelvic lymphadenopathy. Reproductive: Enlarged prostate. Other: No free intraperitoneal fluid or air. Musculoskeletal: Thoracolumbar spondylosis. No acute fracture or destructive osseous lesion. IMPRESSION: 1. Findings compatible with right upper lobe primary lung malignancy with mediastinal nodal and hepatic metastases. 2. Interval enlargement of intermediate density lesions off the right kidney. These may represent hemorrhagic or proteinaceous  cyst however are indeterminate. Continued attention on follow-up. Aortic Atherosclerosis (ICD10-I70.0) and Emphysema (ICD10-J43.9). Electronically Signed   By: Minerva Fester M.D.   On: 05/26/2023 20:03   CT Head Wo Contrast  Result Date: 05/26/2023 CLINICAL DATA:  Altered mental status, sepsis EXAM: CT HEAD WITHOUT CONTRAST TECHNIQUE: Contiguous axial images were obtained from the base of the skull through the vertex without intravenous contrast. RADIATION DOSE REDUCTION: This exam was performed according to the departmental dose-optimization program which includes automated exposure control, adjustment of the mA and/or kV according to patient size and/or use of iterative reconstruction technique. COMPARISON:  None Available. FINDINGS: Brain:  Normal anatomic configuration. Parenchymal volume loss is commensurate with the patient's age. No abnormal intra or extra-axial mass lesion or fluid collection. No abnormal mass effect or midline shift. No evidence of acute intracranial hemorrhage or infarct. Ventricular size is normal. Cerebellum unremarkable. Vascular: No asymmetric hyperdense vasculature at the skull base. Moderate atherosclerotic calcification within the carotid siphons Skull: Intact Sinuses/Orbits: Paranasal sinuses are clear. Orbits are unremarkable. Other: Mastoid air cells and middle ear cavities are clear. IMPRESSION: 1. No acute intracranial hemorrhage or infarct. 2. Age related volume loss. 3. Intracranial atherosclerosis. Electronically Signed   By: Helyn Numbers M.D.   On: 05/26/2023 19:46   DG Chest Portable 1 View  Result Date: 05/26/2023 CLINICAL DATA:  Weakness. EXAM: PORTABLE CHEST 1 VIEW COMPARISON:  October 23, 2014 FINDINGS: Normal cardiac silhouette. Tortuosity of the aorta. Prominence of the bilateral hilar regions. Airspace consolidation versus pulmonary nodule in the right mid thorax. Ground-glass airspace consolidation in the left lower lobe. IMPRESSION: 1. Airspace consolidation versus pulmonary nodule in the right mid thorax. 2. Ground-glass airspace consolidation in the left lower lobe. 3. Prominence of the bilateral hilar regions. 4. This may represent multifocal pneumonia, septic emboli or pulmonary masses. Evaluation with CT of the chest may be considered. Electronically Signed   By: Ted Mcalpine M.D.   On: 05/26/2023 18:37    Labs:  CBC: Recent Labs    05/26/23 1742 05/27/23 0354  WBC 8.0 7.9  HGB 12.9* 12.4*  HCT 39.2 39.6  PLT 399 340    COAGS: Recent Labs    05/26/23 1742  INR 1.1    BMP: Recent Labs    05/26/23 1742 05/27/23 0354  NA 132* 135  K 4.3 4.4  CL 99 102  CO2 23 24  GLUCOSE 142* 132*  BUN 23 19  CALCIUM 13.3* 12.8*  CREATININE 1.51* 1.10  GFRNONAA 44* >60     LIVER FUNCTION TESTS: Recent Labs    05/26/23 1742 05/27/23 0354  BILITOT 0.7 0.9  AST 25 21  ALT 18 17  ALKPHOS 89 76  PROT 7.4 7.2  ALBUMIN 2.9* 2.9*    TUMOR MARKERS: No results for input(s): "AFPTM", "CEA", "CA199", "CHROMGRNA" in the last 8760 hours.  Assessment and Plan:  Admitted for physical deterioration.  CT findings compatible with right upper lobe primary lung malignancy with mediastinal nodal and hepatic metastases.  Risks and benefits of liver lesion was discussed with the patient's daughter including, but not limited to bleeding, infection, damage to adjacent structures or low yield requiring additional tests.  She confirms he takes aspirin daily. Per IR anticoagulation guidelines, liver biopsy is high risk for bleeding and aspirin must be held x 5 days.  All of the questions were answered and there is agreement to proceed.  Consent signed and in IR.  Aspirin being held.  *Addendum* *Spoke with Dr. Lowell Guitar  today - He is going to have goals of care discussion with family and has removed the order for biopsy at this time.  Once goals of care are determined, if family would like to proceed with biopsy, will plan for Thursday as IR schedule allows.  Continue to hold aspirin. Hold SQ anticoagulation the night before and day of procedure. INR is 1.1 on 05/26/23.  Thank you for allowing our service to participate in Gregory Butler 's care.  Electronically Signed: Gwynneth Macleod, PA-C   05/27/2023, 8:52 AM      I spent a total of 40 Minutes  in face to face in clinical consultation, greater than 50% of which was counseling/coordinating care for liver lesion biopsy.

## 2023-05-27 NOTE — Progress Notes (Signed)
Patient very agitated - 4 RN in room to get him back in bed. RN accessed chart to assist primary RN with medication administration. PRN haldol given

## 2023-05-27 NOTE — H&P (Addendum)
PCP:   Chauncy Lean, PA-C   Chief Complaint:  Pale, weak confused gentleman  HPI: This is a 89 year old male who is active at baseline.  He has a past medical history of atrial fibrillation, HTN and HLD.  Approximately a month ago patient was driving, got disoriented and lost.  He was found in [place] by police.  The family has stopped his driving.  Per family he has been on a decline since.  Over the past 2 weeks his appetite has declined, he picks at food.  He has had significant weight loss.  Per family he normally has a good appetite.  He has had some confusion, especially at night.  He now lays around all day, is forgetful, repetitious and listless.  Today he was pale and weak looking, they took him to the ER.  He has a remote history of tobacco use, having quit approximately 10 years ago.  He was taken to the ER patient became completely confused while in the ER.  In the ER blood work done revealed a calcium of 13.3.  CT chest shows a RUL cavitary mass 4.5 x 3.3 cm.  Hepatic metastasis also noted.  Interval enlargement of intermediate low-density lesions of the right kidney.  Presenting blood pressure 72/36.  Patient responsive to 1 L IV fluid bolus.  History provided by daughter [name] and son [name] who both are at bedside  Review of Systems:  Per HPI  Past Medical History: Past Medical History:  Diagnosis Date   Aneurysm artery, iliac (HCC)    Arthritis    Diabetes mellitus without complication (HCC)    GERD (gastroesophageal reflux disease)    Hyperlipemia    Hypertension    Past Surgical History:  Procedure Laterality Date   CHOLECYSTECTOMY     TUMOR EXCISION Right    lower right abdominal    Medications: Prior to Admission medications   Medication Sig Start Date End Date Taking? Authorizing Provider  amLODipine (NORVASC) 5 MG tablet Take 5 mg by mouth daily.    [provider]  Apoaequorin (PREVAGEN EXTRA STRENGTH) 20 MG CAPS Take 20 mg by mouth daily.     [provider]  aspirin 81 MG tablet Take 81 mg by mouth daily.    [provider]  furosemide (LASIX) 20 MG tablet Take 20 mg by mouth daily.    [provider]  metoprolol tartrate (LOPRESSOR) 100 MG tablet Take by mouth. 09/19/15   [provider]  rosuvastatin (CRESTOR) 20 MG tablet Take 20 mg by mouth daily.    [provider]    Allergies:  No Known Allergies  Social History:  reports that he quit smoking about 11 years ago. His smoking use included cigarettes. He quit smokeless tobacco use about 69 years ago. He reports that he does not drink alcohol and does not use drugs.  Family History: Family History  Problem Relation Age of Onset   Heart disease Mother    Heart disease Father     Physical Exam: Vitals:   05/27/23 0015 05/27/23 0030 05/27/23 0130 05/27/23 0224  BP:  (!) 132/91 117/73 134/68  Pulse: 65  73 86  Resp: 19 19 13 20   Temp:    (!) 97.5 F (36.4 C)  TempSrc:    Oral  SpO2: 95%  96% 98%  Weight:      Height:        General:  Alert and oriented times three, well developed and nourished, no acute distress  Eyes: PERRLA, pink conjunctiva, no scleral icterus ENT: Moist oral mucosa, neck supple, no thyromegaly Lungs: clear to ascultation, no wheeze, no crackles, no use of accessory muscles Cardiovascular: regular rate and rhythm, no regurgitation, no gallops, no murmurs. No carotid bruits, no JVD Abdomen: soft, positive BS, non-tender, non-distended, no organomegaly, not an acute abdomen GU: not examined Neuro: CN II - XII grossly intact, sensation intact Musculoskeletal: strength 5/5 all extremities, no clubbing, cyanosis or edema Skin: no rash, no subcutaneous crepitation, no decubitus Psych: appropriate patient   Labs on Admission:  Recent Labs    05/26/23 1742  NA 132*  K 4.3  CL 99  CO2 23  GLUCOSE 142*  BUN 23  CREATININE 1.51*  CALCIUM 13.3*   Recent Labs    05/26/23 1742  AST 25  ALT 18   ALKPHOS 89  BILITOT 0.7  PROT 7.4  ALBUMIN 2.9*   Recent Labs    05/26/23 1742  WBC 8.0  NEUTROABS 5.6  HGB 12.9*  HCT 39.2  MCV 87.1  PLT 399    Radiological Exams on Admission: CT CHEST ABDOMEN PELVIS W CONTRAST  Result Date: 05/26/2023 CLINICAL DATA:  Sepsis protocol.  Altered mental status EXAM: CT CHEST, ABDOMEN, AND PELVIS WITH CONTRAST TECHNIQUE: Multidetector CT imaging of the chest, abdomen and pelvis was performed following the standard protocol during bolus administration of intravenous contrast. RADIATION DOSE REDUCTION: This exam was performed according to the departmental dose-optimization program which includes automated exposure control, adjustment of the mA and/or kV according to patient size and/or use of iterative reconstruction technique. CONTRAST:  80mL OMNIPAQUE IOHEXOL 300 MG/ML  SOLN COMPARISON:  Chest radiograph 05/26/2023; CT 06/18/2019 and report from CT 10/24/2014 FINDINGS: CT CHEST FINDINGS Cardiovascular: Coronary artery and aortic atherosclerotic calcification. No aortic aneurysm or dissection. No pericardial effusion. Mediastinum/Nodes: Unremarkable trachea and esophagus. Mediastinal lymphadenopathy with some lymph node calcifications. The largest is a 1.4 cm subcarinal node on series 2/image 34. Lungs/Pleura: Cavitary mass in the right upper lobe measures 4.5 x 3.3 cm on series 4/image 70 advanced paraseptal and centrilobular emphysema. Peripheral reticular and ground-glass opacities compatible with interstitial lung disease. No pleural effusion or pneumothorax. Musculoskeletal: No acute fracture or destructive osseous lesion. CT ABDOMEN PELVIS FINDINGS Hepatobiliary: New irregular hypoattenuating area in the anterior left hepatic lobe compatible with metastasis measuring 5.6 x 6.5 cm. Additional cystic lesions in the left hepatic lobe are stable and compatible with cysts. Cholecystectomy. No biliary dilation. Pancreas: Unremarkable. Spleen: Unremarkable.  Adrenals/Urinary Tract: Stable adrenal glands. Low-attenuation lesions in the kidneys are statistically likely to represent cysts. No follow-up is required. Interval enlargement of a 2.4 cm intermediate density round lesion off the anterior upper pole of the right kidney on series 2/image 62. Additional new or enlarged intermediate density exophytic lesion off the posterior right kidney on series 2/image 76. These may represent hemorrhagic or proteinaceous cyst however indeterminate. No urinary calculi or hydronephrosis. Unremarkable bladder. Stomach/Bowel: Normal caliber large and small bowel. No bowel wall thickening. Normal appendix. Stomach is within normal limits. Vascular/Lymphatic: Advanced aortic atherosclerotic calcification. No abdominal or pelvic lymphadenopathy. Reproductive: Enlarged prostate. Other: No free intraperitoneal fluid or air. Musculoskeletal: Thoracolumbar spondylosis. No acute fracture or destructive osseous lesion. IMPRESSION: 1. Findings compatible with right upper lobe primary lung malignancy with mediastinal nodal and hepatic metastases. 2. Interval enlargement of intermediate density lesions off the right kidney. These may represent hemorrhagic or proteinaceous cyst however are indeterminate. Continued attention on follow-up. Aortic Atherosclerosis (ICD10-I70.0) and Emphysema (ICD10-J43.9). Electronically Signed  By: Minerva Fester M.D.   On: 05/26/2023 20:03   CT Head Wo Contrast  Result Date: 05/26/2023 CLINICAL DATA:  Altered mental status, sepsis EXAM: CT HEAD WITHOUT CONTRAST TECHNIQUE: Contiguous axial images were obtained from the base of the skull through the vertex without intravenous contrast. RADIATION DOSE REDUCTION: This exam was performed according to the departmental dose-optimization program which includes automated exposure control, adjustment of the mA and/or kV according to patient size and/or use of iterative reconstruction technique. COMPARISON:  None  Available. FINDINGS: Brain: Normal anatomic configuration. Parenchymal volume loss is commensurate with the patient's age. No abnormal intra or extra-axial mass lesion or fluid collection. No abnormal mass effect or midline shift. No evidence of acute intracranial hemorrhage or infarct. Ventricular size is normal. Cerebellum unremarkable. Vascular: No asymmetric hyperdense vasculature at the skull base. Moderate atherosclerotic calcification within the carotid siphons Skull: Intact Sinuses/Orbits: Paranasal sinuses are clear. Orbits are unremarkable. Other: Mastoid air cells and middle ear cavities are clear. IMPRESSION: 1. No acute intracranial hemorrhage or infarct. 2. Age related volume loss. 3. Intracranial atherosclerosis. Electronically Signed   By: Helyn Numbers M.D.   On: 05/26/2023 19:46   DG Chest Portable 1 View  Result Date: 05/26/2023 CLINICAL DATA:  Weakness. EXAM: PORTABLE CHEST 1 VIEW COMPARISON:  October 23, 2014 FINDINGS: Normal cardiac silhouette. Tortuosity of the aorta. Prominence of the bilateral hilar regions. Airspace consolidation versus pulmonary nodule in the right mid thorax. Ground-glass airspace consolidation in the left lower lobe. IMPRESSION: 1. Airspace consolidation versus pulmonary nodule in the right mid thorax. 2. Ground-glass airspace consolidation in the left lower lobe. 3. Prominence of the bilateral hilar regions. 4. This may represent multifocal pneumonia, septic emboli or pulmonary masses. Evaluation with CT of the chest may be considered. Electronically Signed   By: Ted Mcalpine M.D.   On: 05/26/2023 18:37    Assessment/Plan Present on Admission:  Hypercalcemia -Likely hypercalcemia d/t mallignancy -IV fluid hydration -Meets criteria for both [place] calcitonin and zoledronic acid, both ordered -2D echo given patient may need aggressive IV fluid hydration.  Cardiac status unknown -Ionized Ca in AM. PTH and PTHrp ordered -IR consult for biopsy of lung  mass -Corrected Calcium 14.2  RUL Lung Ca w/ liver met -IR consulted for biopsy -Defer to a.m. team for Heme onc consult/staging workup  Lactic acidosis -Normalized with IV fluids.  d/t dehydration from hypercalcemia  Acute metabolic encephalopathy -Secondary to hypercalcemia -Treatment of underlying cause initiated -Mitt restraints and PRN haldol ordered  Atrial fibrillaion  -2D echo -Metoprolol initiated 12.5mg  BiD -hold until patient fully fluid resuscitated -CHADvasc score >2.  Anticoagulation not initiated given patient's hepatic/pulmonary malignancy  AKI/dehydration -Creatinine 1.5, previous creatinine normal.  Secondary to hypercalcemia -Hypertension on presentation.  1 L NS bolus.  IVF hydration continued  Hyperglycemia -No history of diabetes. -HgbA1c ordered  HTN -Hold nornasc -Start metoprolol d/t atrial fibrillation  HLD -Resume crestor  Diamonds Lippard 05/27/2023, 2:37 AM

## 2023-05-28 LAB — CBC WITH DIFFERENTIAL/PLATELET
Abs Immature Granulocytes: 0.04 10*3/uL (ref 0.00–0.07)
Basophils Absolute: 0 10*3/uL (ref 0.0–0.1)
Basophils Relative: 0 %
Eosinophils Absolute: 0 10*3/uL (ref 0.0–0.5)
Eosinophils Relative: 0 %
HCT: 39.7 % (ref 39.0–52.0)
Hemoglobin: 12.9 g/dL — ABNORMAL LOW (ref 13.0–17.0)
Immature Granulocytes: 0 %
Lymphocytes Relative: 6 %
Lymphs Abs: 0.6 10*3/uL — ABNORMAL LOW (ref 0.7–4.0)
MCH: 28.9 pg (ref 26.0–34.0)
MCHC: 32.5 g/dL (ref 30.0–36.0)
MCV: 89 fL (ref 80.0–100.0)
Monocytes Absolute: 0.6 10*3/uL (ref 0.1–1.0)
Monocytes Relative: 6 %
Neutro Abs: 8.2 10*3/uL — ABNORMAL HIGH (ref 1.7–7.7)
Neutrophils Relative %: 88 %
Platelets: 342 10*3/uL (ref 150–400)
RBC: 4.46 MIL/uL (ref 4.22–5.81)
RDW: 14.6 % (ref 11.5–15.5)
WBC: 9.4 10*3/uL (ref 4.0–10.5)
nRBC: 0 % (ref 0.0–0.2)

## 2023-05-28 LAB — CALCIUM, IONIZED: Calcium, Ionized, Serum: 7.1 mg/dL — ABNORMAL HIGH (ref 4.5–5.6)

## 2023-05-28 LAB — COMPREHENSIVE METABOLIC PANEL
ALT: 18 U/L (ref 0–44)
AST: 24 U/L (ref 15–41)
Albumin: 2.8 g/dL — ABNORMAL LOW (ref 3.5–5.0)
Alkaline Phosphatase: 88 U/L (ref 38–126)
Anion gap: 9 (ref 5–15)
BUN: 11 mg/dL (ref 8–23)
CO2: 22 mmol/L (ref 22–32)
Calcium: 10.8 mg/dL — ABNORMAL HIGH (ref 8.9–10.3)
Chloride: 107 mmol/L (ref 98–111)
Creatinine, Ser: 0.92 mg/dL (ref 0.61–1.24)
GFR, Estimated: >60 mL/min (ref >60)
Glucose, Bld: 122 mg/dL — ABNORMAL HIGH (ref 70–99)
Potassium: 3.6 mmol/L (ref 3.5–5.1)
Sodium: 138 mmol/L (ref 135–145)
Total Bilirubin: 1.2 mg/dL (ref 0.3–1.2)
Total Protein: 7 g/dL (ref 6.5–8.1)

## 2023-05-28 LAB — FOLATE: Folate: 12.6 ng/mL (ref >5.9)

## 2023-05-28 LAB — VITAMIN B12: Vitamin B-12: 854 pg/mL (ref 180–914)

## 2023-05-28 LAB — HEMOGLOBIN A1C
Hgb A1c MFr Bld: 6.2 % — ABNORMAL HIGH (ref 4.8–5.6)
Mean Plasma Glucose: 131 mg/dL

## 2023-05-28 LAB — TSH: TSH: 0.599 u[IU]/mL (ref 0.350–4.500)

## 2023-05-28 MED ORDER — DILTIAZEM HCL-DEXTROSE 125-5 MG/125ML-% IV SOLN (PREMIX)
5.0000 mg/h | INTRAVENOUS | Status: DC
  Administered 2023-05-28: 5 mg/h via INTRAVENOUS
  Administered 2023-05-29: 15 mg/h via INTRAVENOUS
  Filled 2023-05-28 (×2): qty 125

## 2023-05-28 MED ORDER — DEXTROSE-SODIUM CHLORIDE 5-0.9 % IV SOLN: INTRAVENOUS | Status: DC
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
## 2023-05-29 ENCOUNTER — Inpatient Hospital Stay (HOSPITAL_COMMUNITY): Payer: Medicare Other

## 2023-05-29 LAB — CBC WITH DIFFERENTIAL/PLATELET
Abs Immature Granulocytes: 0.03 10*3/uL (ref 0.00–0.07)
Basophils Absolute: 0 10*3/uL (ref 0.0–0.1)
Basophils Relative: 0 %
Eosinophils Absolute: 0 10*3/uL (ref 0.0–0.5)
Eosinophils Relative: 0 %
HCT: 36.7 % — ABNORMAL LOW (ref 39.0–52.0)
Hemoglobin: 12.1 g/dL — ABNORMAL LOW (ref 13.0–17.0)
Immature Granulocytes: 0 %
Lymphocytes Relative: 7 %
Lymphs Abs: 0.6 10*3/uL — ABNORMAL LOW (ref 0.7–4.0)
MCH: 29.1 pg (ref 26.0–34.0)
MCHC: 33 g/dL (ref 30.0–36.0)
MCV: 88.2 fL (ref 80.0–100.0)
Monocytes Absolute: 0.6 10*3/uL (ref 0.1–1.0)
Monocytes Relative: 7 %
Neutro Abs: 7.4 10*3/uL (ref 1.7–7.7)
Neutrophils Relative %: 86 %
Platelets: 300 10*3/uL (ref 150–400)
RBC: 4.16 MIL/uL — ABNORMAL LOW (ref 4.22–5.81)
RDW: 14.7 % (ref 11.5–15.5)
WBC: 8.7 10*3/uL (ref 4.0–10.5)
nRBC: 0 % (ref 0.0–0.2)

## 2023-05-29 LAB — MAGNESIUM: Magnesium: 1.3 mg/dL — ABNORMAL LOW (ref 1.7–2.4)

## 2023-05-29 LAB — COMPREHENSIVE METABOLIC PANEL
ALT: 18 U/L (ref 0–44)
AST: 28 U/L (ref 15–41)
Albumin: 2.7 g/dL — ABNORMAL LOW (ref 3.5–5.0)
Alkaline Phosphatase: 82 U/L (ref 38–126)
Anion gap: 9 (ref 5–15)
BUN: 10 mg/dL (ref 8–23)
CO2: 21 mmol/L — ABNORMAL LOW (ref 22–32)
Calcium: 10.2 mg/dL (ref 8.9–10.3)
Chloride: 110 mmol/L (ref 98–111)
Creatinine, Ser: 0.98 mg/dL (ref 0.61–1.24)
GFR, Estimated: >60 mL/min (ref >60)
Glucose, Bld: 138 mg/dL — ABNORMAL HIGH (ref 70–99)
Potassium: 3.2 mmol/L — ABNORMAL LOW (ref 3.5–5.1)
Sodium: 140 mmol/L (ref 135–145)
Total Bilirubin: 1.4 mg/dL — ABNORMAL HIGH (ref 0.3–1.2)
Total Protein: 6.5 g/dL (ref 6.5–8.1)

## 2023-05-29 LAB — PHOSPHORUS: Phosphorus: 1.7 mg/dL — ABNORMAL LOW (ref 2.5–4.6)

## 2023-05-29 LAB — PTH, INTACT AND CALCIUM
Calcium, Total (PTH): 12.5 mg/dL — ABNORMAL HIGH (ref 8.6–10.2)
PTH: 10 pg/mL — ABNORMAL LOW (ref 15–65)

## 2023-05-29 MED ORDER — MAGNESIUM SULFATE 4 GM/100ML IV SOLN
4.0000 g | Freq: Once | INTRAVENOUS | Status: AC
  Administered 2023-05-29: 4 g via INTRAVENOUS
  Filled 2023-05-29: qty 100

## 2023-05-29 MED ORDER — POTASSIUM PHOSPHATES 15 MMOLE/5ML IV SOLN
15.0000 mmol | Freq: Once | INTRAVENOUS | Status: AC
  Administered 2023-05-29: 15 mmol via INTRAVENOUS
  Filled 2023-05-29: qty 5

## 2023-05-29 MED ORDER — POTASSIUM CHLORIDE 10 MEQ/100ML IV SOLN
10.0000 meq | INTRAVENOUS | Status: AC
  Administered 2023-05-29 (×4): 10 meq via INTRAVENOUS
  Filled 2023-05-29 (×4): qty 100

## 2023-05-29 NOTE — Progress Notes (Signed)
Patient HR dropped to 43 briefly then increased to 60-70s. Still in A-Fib. BP 88/50 at 1100, 111/48 on recheck, MAP 64. Cardizem infusing at 15 mg/hr at that time. MD Lowell Guitar notified- Cardizem stopped at 1108. Telemetry made aware. Will continue to monitor closely.

## 2023-05-29 NOTE — Progress Notes (Signed)
PROGRESS NOTE    Gregory Butler  UJW:[id number] DOB: 01/25/1932 DOA: 05/26/2023 PCP: Chauncy Lean, PA-C  Chief Complaint  Patient presents with   Weakness    Brief Narrative:    89 year old male who is active at baseline.  He has Gregory Butler past medical history of atrial fibrillation, HTN and HLD.  Approximately Gregory Butler month ago patient was driving, got disoriented and lost.  He was found in [place] by police.  The family has stopped his driving.  Per family he has been on Gregory Butler decline since.  Over the past 2 weeks his appetite has declined, he picks at food.  He has had significant weight loss.  Per family he normally has Gregory Butler good appetite.  He has had some confusion, especially at night.  He now lays around all day, is forgetful, repetitious and listless.  Today he was pale and weak looking, they took him to the ER.  He has Gregory Butler remote history of tobacco use, having quit approximately 10 years ago.  He was taken to the ER patient became completely confused while in the ER.   He was admitted with hypercalcemia and hypotension as well as acute metabolic encephalopathy.  He's improved with IVF/zometa from hypercalcemia standpoint, though his encephalopathy remains present.  Family interested in biopsy for his imaging findings consistent with metastatic lung cancer.  Assessment & Plan:   Principal Problem:   Hypercalcemia Active Problems:   Atrial fibrillation (HCC)   Lactic acid acidosis   Lung cancer (HCC)   Metastasis to liver (HCC)   Hyperglycemia  Goals of Care Overall his prognosis seems to be poor, but he has improved some over the past few days.  Family currently wants to see how he does with supportive care, will follow for now and reevaluate with time.  They were interested in biopsy, which has been ordered.  My recommendation would be that before committing to Myra Weng biopsy, we reevaluate how he's doing and how the biopsy would change our management (is he improving to the point we'd consider cancer  treatment).    Acute Metabolic Encephalopathy  Delirium Lethargic for me, but per discussion with RN, had improved significantly this AM -> fluctuating At baseline, able to do some things for himself (get up, wash up, etc) - got lost Gregory Butler month ago driving, so they stopped letting him drive.  Has been repeating himself more.  Eating less. Delirium precautions High dose thiamine given poor PO intake B12, folate, TSH all wnl  Will follow MRI brain Treat hypercalcemia Appreciate SLP eval  Dysphagia Dysphagia 1, thin liquid SLP recommending MBS  Hypotension  Lactic Acidosis Suspect related to hypovolemia with poor PO intake and hypercalcemia Improved (occurred again today with diltiazem gtt, this is on hold now) Holding home norvasc and metoprolol and lasix  Hypercalcemia S/p zolendronic acid Will continue IVF Follow PTH (nonparathyroid hypercalcemia), PTHrp pending Suspect this is related to his malignancy  Hypomagnesemia  Hypophosphatemia Hypokalemia Replace and follow   Metastatic Lung Cancer Presumed based on imaging with right upper lobe primary lung malignancy with mediastinal nodal and hepatic metastases Will reconsider biopsy based on how he does with supportive care  IR considering biopsy on Thursday (after aspirin held x5 days)  Atrial Fibrillation with RVR Metoprolol on hold Echo -> EF 70-75%, no RWMA, severely elevated PASP, moderate to severe AVS, dilated IVC with <50% resp variability Hold anticoagulation for now Stop diltiazem gtt with brady and hypotension noted - will monitor for now, rates currently controlled  AKI  Improving  Prediabtetes Follow A1c 6.2  Dyslipidemia Crestor if able to tolerate  Intermediate Density lesions off R Kidney Hemorrhagic or proteinaceous cysts?    DVT prophylaxis: heparin Code Status: full Family Communication: daughter Disposition:   Status is: Inpatient Remains inpatient appropriate because: continued need for  inpatient care   Consultants:  IR  Procedures:  none  Antimicrobials:  Anti-infectives (From admission, onward)    None       Subjective: Lethargic, difficult to understand Daughter at bedside  Objective: Vitals:   05/29/23 1300 05/29/23 1400 05/29/23 1500 05/29/23 1600  BP: (!) 131/46 (!) 117/44 124/61 131/72  Pulse:    88  Resp: 18 14 (!) 21 (!) 21  Temp:      TempSrc:      SpO2:    94%  Weight:      Height:        Intake/Output Summary (Last 24 hours) at 05/29/2023 1736 Last data filed at 05/29/2023 1600 Gross per 24 hour  Intake 3068.34 ml  Output 950 ml  Net 2118.34 ml   Filed Weights   05/26/23 1725 05/28/23 0745  Weight: 98 kg 73.1 kg    Examination:  General: No acute distress. Cardiovascular: irregularly irregular, fluctuating rate Lungs: unlabored Abdomen: Soft, nontender, nondistended  Neurological: more alert today, but still lethargic, difficult to understand his speech Extremities: No clubbing or cyanosis. No edema.   Data Reviewed: I have personally reviewed following labs and imaging studies  CBC: Recent Labs  Lab 05/26/23 1742 05/27/23 0354 05/28/23 0929 05/29/23 0351  WBC 8.0 7.9 9.4 8.7  NEUTROABS 5.6 5.7 8.2* 7.4  HGB 12.9* 12.4* 12.9* 12.1*  HCT 39.2 39.6 39.7 36.7*  MCV 87.1 90.0 89.0 88.2  PLT 399 340 342 300    Basic Metabolic Panel: Recent Labs  Lab 05/26/23 1742 05/27/23 0354 05/28/23 0929 05/29/23 0351  NA 132* 135 138 140  K 4.3 4.4 3.6 3.2*  CL 99 102 107 110  CO2 23 24 22  21*  GLUCOSE 142* 132* 122* 138*  BUN 23 19 11 10   CREATININE 1.51* 1.10 0.92 0.98  CALCIUM 13.3* 12.8*  12.5* 10.8* 10.2  MG  --   --   --  1.3*  PHOS  --   --   --  1.7*    GFR: Estimated Creatinine Clearance: 49.4 mL/min (by C-G formula based on SCr of 0.98 mg/dL).  Liver Function Tests: Recent Labs  Lab 05/26/23 1742 05/27/23 0354 05/28/23 0929 05/29/23 0351  AST 25 21 24 28   ALT 18 17 18 18   ALKPHOS 89 76 88 82   BILITOT 0.7 0.9 1.2 1.4*  PROT 7.4 7.2 7.0 6.5  ALBUMIN 2.9* 2.9* 2.8* 2.7*    CBG: No results for input(s): "GLUCAP" in the last 168 hours.   Recent Results (from the past 240 hour(s))  Culture, blood (Routine x 2)     Status: None (Preliminary result)   Collection Time: 05/26/23  5:42 PM   Specimen: BLOOD  Result Value Ref Range Status   Specimen Description   Final    BLOOD RIGHT ANTECUBITAL Performed at [facility], [address] [place], [place] [address]    Special Requests   Final    BOTTLES DRAWN AEROBIC AND ANAEROBIC Blood Culture adequate volume Performed at [facility], [address] [place], [place] [address]    Culture   Final    NO GROWTH 3 DAYS Performed at [facility], [address] [place], [place] [address]    Report Status PENDING  Incomplete  Culture, blood (Routine x 2)     Status: None (Preliminary result)   Collection Time: 05/26/23  5:45 PM   Specimen: BLOOD  Result Value Ref Range Status   Specimen Description   Final    BLOOD BLOOD LEFT FOREARM Performed at [facility], [address] [place], [place] [address]    Special Requests   Final    BOTTLES DRAWN AEROBIC AND ANAEROBIC Blood Culture adequate volume Performed at [facility], [address] [place], [place] [address]    Culture   Final    NO GROWTH 3 DAYS Performed at [facility], [address], [place], [place] [address]    Report Status PENDING  Incomplete         Radiology Studies: No results found.      Scheduled Meds:  heparin  5,000 Units Subcutaneous Q8H   [START ON 06/04/2023] thiamine (VITAMIN B1) injection  100 mg Intravenous Daily   Continuous Infusions:  dextrose 5 % and 0.9 % NaCl 100 mL/hr at 05/29/23 0147   diltiazem (CARDIZEM) infusion Stopped (05/29/23 1108)   potassium PHOSPHATE IVPB (in mmol) 15 mmol (05/29/23 1629)   thiamine (VITAMIN B1) injection 500 mg (05/29/23 1539)    Followed by   Melene Muller ON 05/30/2023] thiamine (VITAMIN B1) injection       LOS: 2 days    Time spent: over 30 min    Lacretia Nicks, MD [facility]   To contact the attending provider between 7A-7P or the covering provider during after hours 7P-7A, please log into the web site [email or web address] and access using universal [place] password for that web site. If you do not have the password, please call the hospital operator.  05/29/2023, 5:36 PM

## 2023-05-29 NOTE — Evaluation (Signed)
Clinical/Bedside Swallow Evaluation Patient Details  Name: Gregory Butler MRN: [id number] Date of Birth: Mar 15, 1932  Today's Date: 05/29/2023 Time: SLP Start Time (ACUTE ONLY): 1515 SLP Stop Time (ACUTE ONLY): 1610 SLP Time Calculation (min) (ACUTE ONLY): 55 min  Past Medical History:  Past Medical History:  Diagnosis Date   Aneurysm artery, iliac (HCC)    Arthritis    Diabetes mellitus without complication (HCC)    GERD (gastroesophageal reflux disease)    Hyperlipemia    Hypertension    Past Surgical History:  Past Surgical History:  Procedure Laterality Date   CHOLECYSTECTOMY     TUMOR EXCISION Right    lower right abdominal   HPI:  89 yo adm to [facility] with progressive decline, sleeping often with poor intake, progressive decline, Afib, found to have cavitary mass of right upper lung - concerning for cancer.  MD recommends comfort care - and potential work up for lung mass is underway *tentatively scheduled for Thursday, 05/31/2023.  Swallow eval ordered.  Pt resides with his daughter- she reports he has had decreased intake recently stating he "gets full fast".  Denies pt having issues with swallowing - and pt confirms. Pt's brain CT showed  Parenchymal volume loss is  commensurate with the patient's age but no acute events.   Daughter reports pt's voice has been hoarse/weak recently and he chronically clears his throat.  Pt denies any issues with reflux and daughter concurs.    Assessment / Plan / Recommendation  Clinical Impression  Pt currently presenting with clinical indication of multifactorial dysphagia - clinical presentation of esophageal dysphagia c/b eructation, throat clearing and effortful disorganized swallowing. Suspect cognitive issue as well, pt is dysarthric and weak.  In addition, he demonstrates frequent throat clearing and overt wet voicing with sequential swallows of thin water - concerning for airway infiltration.  Small single sips tolerated better than sequential  swallows.  Daughter denies pt coughing with intake at home - but admits his intake has been poor as of late.  Pt admits to difficulty with managing solids prior to admission and had prolonged mastication with solids with retention with decreased awareness. Open mouth posture with xerostomia and lacks dentition also attributes to difficulties.  No indication of difficulty with applesauce nor Ensure.  Daughter reports pt's voice is hoarse and she questions reasoning and she questions if pt can undergo testing for his throat re: swallowing.  Given he is a FULL CODE and concern for aspiration present, recommend MBS to determine appropriate exercises to improve swallowing and mitigate dysphagia. SLP Visit Diagnosis: Dysphagia, oropharyngeal phase (R13.12);Dysphagia, unspecified (R13.10)    Aspiration Risk  Moderate aspiration risk;Risk for inadequate nutrition/hydration    Diet Recommendation Dysphagia 1 (Puree);Thin liquid    Liquid Administration via: Cup;Straw Medication Administration: Whole meds with puree Supervision: Patient able to self feed Compensations: Minimize environmental distractions;Small sips/bites;Slow rate Postural Changes: Seated upright at 90 degrees;Remain upright for at least 30 minutes after po intake    Other  Recommendations Oral Care Recommendations: Oral care BID    Recommendations for follow up therapy are one component of a multi-disciplinary discharge planning process, led by the attending physician.  Recommendations may be updated based on patient status, additional functional criteria and insurance authorization.  Follow up Recommendations Follow physician's recommendations for discharge plan and follow up therapies      Assistance Recommended at Discharge    Functional Status Assessment Patient has had a recent decline in their functional status and demonstrates the ability to make  significant improvements in function in a reasonable and predictable amount of  time.  Frequency and Duration min 1 x/week  1 week       Prognosis Barriers to Reach Goals: Cognitive deficits      Swallow Study   General Date of Onset: 05/29/23 HPI: 89 yo adm to [facility] with progressive decline, sleeping often with poor intake, progressive decline, Afib, found to have cavitary mass of right upper lung - concerning for cancer.  MD recommends comfort care - and potential work up for lung mass is underway *tentatively scheduled for Thursday, 05/31/2023.  Swallow eval ordered.  Pt resides with his daughter- she reports he has had decreased intake recently stating he "gets full fast".  Denies pt having issues with swallowing - and pt confirms. Pt's brain CT showed  Parenchymal volume loss is  commensurate with the patient's age but no acute events.   Daughter reports pt's voice has been hoarse/weak recently and he chronically clears his throat.  Pt denies any issues with reflux and daughter concurs. Type of Study: Bedside Swallow Evaluation Previous Swallow Assessment: see HPI Diet Prior to this Study: NPO Temperature Spikes Noted: No Respiratory Status: Nasal cannula History of Recent Intubation: No Behavior/Cognition: Alert;Cooperative;Pleasant mood Oral Cavity Assessment: Dry Oral Care Completed by SLP: Yes Oral Cavity - Dentition: Edentulous Vision: Functional for self-feeding Self-Feeding Abilities: Needs assist Patient Positioning: Upright in bed Baseline Vocal Quality: Hoarse;Low vocal intensity Volitional Cough: Congested Volitional Swallow: Unable to elicit    Oral/Motor/Sensory Function Overall Oral Motor/Sensory Function: Generalized oral weakness   Ice Chips Ice chips: Not tested   Thin Liquid Thin Liquid: Impaired Presentation: Cup;Spoon;Straw Oral Phase Impairments: Reduced lingual movement/coordination Pharyngeal  Phase Impairments: Change in Vital Signs;Multiple swallows;Wet Vocal Quality;Cough - Delayed;Throat Clearing - Immediate;Suspected delayed  Swallow Other Comments: small single boluses tolerated well    Nectar Thick Nectar Thick Liquid: Impaired Presentation: Cup;Spoon;Straw Pharyngeal Phase Impairments: Multiple swallows;Suspected delayed Swallow   Honey Thick Honey Thick Liquid: Not tested   Puree Puree: Within functional limits Presentation: Self Fed;Spoon   Solid     Solid: Impaired Presentation: Self Fed Oral Phase Impairments: Reduced lingual movement/coordination Oral Phase Functional Implications: Impaired mastication;Oral residue;Prolonged oral transit Pharyngeal Phase Impairments: Cough - Delayed;Suspected delayed Andrey Spearman 05/29/2023,4:57 PM  [name], MS [facility] SLP Acute Rehab Services Office [phone]

## 2023-05-29 NOTE — Progress Notes (Signed)
Speech Language Pathology Treatment: Dysphagia  Patient Details Name: Xyler Terpening MRN: [id number] DOB: [birth year] Today's Date: 05/29/2023 Time: 4332-9518 SLP Time Calculation (min) (ACUTE ONLY): 10 min  Assessment / Plan / Recommendation Clinical Impression  SLP went to post swallow precaution signs in pt's room and informed daughter that pt had prior Esophagram: 3/14/219: that showed esophageal deficits. Advised this aligns with some of his current symptoms observed during clinical swallow evaluation.    DG esophagram:  3/14/219:  results "There is some mild lateral impression bilaterally on the lower cervical esophagus presumably due to the enlarged thyroid gland, but not causing critical narrowing. 2. Nonspecific esophageal dysmotility disorder, with disruption of primary peristaltic waves in the distal esophagus. 3. Small to moderate-sized hiatal hernia. 4. We were unable to fully distend the distal esophagus. Smooth narrowing of the distal esophagus may be present. A 13 mm barium tablet transiently impacted in this vicinity. 5. Single episode of gastroesophageal reflux."  Pt also has penetration to cords of nectar liquids in prone position on that test.    In addition, pt does NOT have cognitive impairment diagnosis but daughter does report he got lost a few months ago when driving to the grocery store *familiar place* and ended up in [place]. She also reports that he repeats himself often.  This causes SLP to suspect pt having some progressive cognitive decline that may contribute to dysphagia.    Further discussed dysphagia if pt does have progressive cognitive issues- and lack of any scientific benefit of feeding tubes for pt's over the age of 29 with any cognitive deficits. She reports she does not want her dad to have a feeding tube.  Concept of po for enjoyment also broached to which daughter reported understanding.    With pt's lung mass, ? If potential compression of vagus nerve  could be present impacting pt's voice.    As of now, will proceed with MBS per daughter's wishes during this hospital coarse. Will coordinate with xray for testing. In the interim, very strict aspiration precautions advised.      Pt rapidly falling asleep after first session and during second visit.     HPI HPI: 89 yo adm to [facility] with progressive decline, sleeping often with poor intake, progressive decline, Afib, found to have cavitary mass of right upper lung - concerning for cancer.  MD recommends comfort care - and potential work up for lung mass is underway *tentatively scheduled for Thursday, 05/31/2023.  Swallow eval ordered.  Pt resides with his daughter- she reports he has had decreased intake recently stating he "gets full fast".  Denies pt having issues with swallowing - and pt confirms. Pt's brain CT showed  Parenchymal volume loss is  commensurate with the patient's age but no acute events.   Daughter reports pt's voice has been hoarse/weak recently and he chronically clears his throat.  Pt denies any issues with reflux and daughter concurs.      SLP Plan  Continue with current plan of care;MBS (MBS if aligns with pt's care plan)      Recommendations for follow up therapy are one component of a multi-disciplinary discharge planning process, led by the attending physician.  Recommendations may be updated based on patient status, additional functional criteria and insurance authorization.    Recommendations  Diet recommendations: Dysphagia 1 (puree);Thin liquid Medication Administration: Whole meds with puree Supervision: Staff to assist with self feeding Compensations: Minimize environmental distractions;Small sips/bites;Slow rate Postural Changes and/or Swallow Maneuvers: Seated upright 90 degrees;Upright  30-60 min after meal                  Oral care BID     Dysphagia, oropharyngeal phase (R13.12);Dysphagia, unspecified (R13.10)     Continue with current plan of  care;MBS (MBS if aligns with pt's care plan)    [name], MS [facility] SLP Acute Rehab Services Office [phone]  Chales Abrahams  05/29/2023, 5:00 PM

## 2023-05-29 NOTE — Progress Notes (Signed)
Initial Nutrition Assessment  DOCUMENTATION CODES:   Not applicable  INTERVENTION:  - Plan for SLP eval today to determine ability for diet advancement.  - If diet advancement, recommend Ensure Plus High Protein po TID, each supplement provides 350 kcal and 20 grams of protein. - Monitor weight trends.   - Will monitor for ongoing GOC discussions.   NUTRITION DIAGNOSIS:   Inadequate oral intake related to inability to eat as evidenced by NPO status.  GOAL:   Patient will meet greater than or equal to 90% of their needs  MONITOR:   Diet advancement, Weight trends  REASON FOR ASSESSMENT:   Malnutrition Screening Tool    ASSESSMENT:   89 y.o. male with PMH HTN, HLD who is active at baseline presented after a 2 week decline including decreased appetite, weight loss, confusion, and weakness. Admitted for hypercalcemia. Now with presumed metastatic lung cancer based on imaging.   Patient noted to be experiencing acute metabolic encephalopathy and having unintelligible speech. Unable to obtain nutrition history at this time.   Per chart review, no weight history in chart since 2020. Suspect initial weight this admission of 216# was a carry over from 2020 and that weight of 161# is more accurate.   H&P indicates that family reported patient usually as a good appetite but over the past two weeks it has decreased and he has only been picking at his food. They also reported he has had weight loss however due to no weight history recently unable to verify weight trends.  Patient has been NPO since admission. SLP to evaluate patient today for diet advancement.  Per attending Dr. Roanna Banning note yesterday, goals of care discussed with family 7/16. Concern that patient not a candidate for cancer treatment due to abrupt decline and delirium. MD recommending consideration of comfort care. Plan to monitor improvement the next few days.   Medications reviewed and include: high dose thiamine  (500mg  IV Q8H x3 days, then 250mg  IVU daily x5 days, then 100mg  IV daily), D5 @ 145mL/hr (provides 408 kcals over 24 hours)  Labs reviewed:  K+ 3.2 Phosphorus 1.7 Magnesium 1.3   NUTRITION - FOCUSED PHYSICAL EXAM:  RD working remotely  Diet Order:   Diet Order             Diet NPO time specified Except for: Sips with Meds  Diet effective midnight           Diet NPO time specified Except for: Sips with Meds  Diet effective now                   EDUCATION NEEDS:  Not appropriate for education at this time  Skin:  Skin Assessment: Reviewed RN Assessment  Last BM:  7/13  Height:  Ht Readings from Last 1 Encounters:  05/26/23 5\' 8"  (1.727 m)   Weight:  Wt Readings from Last 1 Encounters:  05/28/23 73.1 kg    BMI:  Body mass index is 24.5 kg/m.  Estimated Nutritional Needs:  Kcal:  1800-2000 kcals Protein:  85-100 grams Fluid:  >/= 1.8L    Shelle Iron RD, LDN For contact information, refer to [facility].

## 2023-05-30 DIAGNOSIS — C787 Secondary malignant neoplasm of liver and intrahepatic bile duct: Secondary | ICD-10-CM | POA: Diagnosis not present

## 2023-05-30 LAB — CBC WITH DIFFERENTIAL/PLATELET
Abs Immature Granulocytes: 0.01 10*3/uL (ref 0.00–0.07)
Basophils Absolute: 0 10*3/uL (ref 0.0–0.1)
Basophils Relative: 0 %
Eosinophils Absolute: 0.2 10*3/uL (ref 0.0–0.5)
Eosinophils Relative: 2 %
HCT: 31.8 % — ABNORMAL LOW (ref 39.0–52.0)
Hemoglobin: 10.3 g/dL — ABNORMAL LOW (ref 13.0–17.0)
Immature Granulocytes: 0 %
Lymphocytes Relative: 9 %
Lymphs Abs: 0.8 10*3/uL (ref 0.7–4.0)
MCH: 28.9 pg (ref 26.0–34.0)
MCHC: 32.4 g/dL (ref 30.0–36.0)
MCV: 89.3 fL (ref 80.0–100.0)
Monocytes Absolute: 0.5 10*3/uL (ref 0.1–1.0)
Monocytes Relative: 6 %
Neutro Abs: 7.1 10*3/uL (ref 1.7–7.7)
Neutrophils Relative %: 83 %
Platelets: 250 10*3/uL (ref 150–400)
RBC: 3.56 MIL/uL — ABNORMAL LOW (ref 4.22–5.81)
RDW: 15.2 % (ref 11.5–15.5)
WBC: 8.6 10*3/uL (ref 4.0–10.5)
nRBC: 0 % (ref 0.0–0.2)

## 2023-05-30 LAB — COMPREHENSIVE METABOLIC PANEL
ALT: 17 U/L (ref 0–44)
AST: 27 U/L (ref 15–41)
Albumin: 2.4 g/dL — ABNORMAL LOW (ref 3.5–5.0)
Alkaline Phosphatase: 70 U/L (ref 38–126)
Anion gap: 7 (ref 5–15)
BUN: 11 mg/dL (ref 8–23)
CO2: 20 mmol/L — ABNORMAL LOW (ref 22–32)
Calcium: 9.4 mg/dL (ref 8.9–10.3)
Chloride: 110 mmol/L (ref 98–111)
Creatinine, Ser: 1 mg/dL (ref 0.61–1.24)
GFR, Estimated: >60 mL/min (ref >60)
Glucose, Bld: 119 mg/dL — ABNORMAL HIGH (ref 70–99)
Potassium: 3.6 mmol/L (ref 3.5–5.1)
Sodium: 137 mmol/L (ref 135–145)
Total Bilirubin: 0.6 mg/dL (ref 0.3–1.2)
Total Protein: 5.6 g/dL — ABNORMAL LOW (ref 6.5–8.1)

## 2023-05-30 LAB — MAGNESIUM: Magnesium: 1.8 mg/dL (ref 1.7–2.4)

## 2023-05-30 LAB — PHOSPHORUS: Phosphorus: 1.6 mg/dL — ABNORMAL LOW (ref 2.5–4.6)

## 2023-05-30 MED ORDER — MAGNESIUM SULFATE 2 GM/50ML IV SOLN
2.0000 g | Freq: Once | INTRAVENOUS | Status: AC
  Administered 2023-05-30: 2 g via INTRAVENOUS
  Filled 2023-05-30: qty 50

## 2023-05-30 MED ORDER — POTASSIUM PHOSPHATES 15 MMOLE/5ML IV SOLN
30.0000 mmol | Freq: Once | INTRAVENOUS | Status: AC
  Administered 2023-05-30: 30 mmol via INTRAVENOUS
  Filled 2023-05-30: qty 10

## 2023-05-30 MED ORDER — LORAZEPAM 2 MG/ML IJ SOLN
0.2500 mg | Freq: Once | INTRAMUSCULAR | Status: AC
  Administered 2023-05-30: 0.25 mg via INTRAVENOUS
  Filled 2023-05-30: qty 1

## 2023-05-30 NOTE — Plan of Care (Signed)
  Problem: Health Behavior/Discharge Planning: Goal: Ability to manage health-related needs will improve Outcome: Not Progressing   Problem: Clinical Measurements: Goal: Ability to maintain clinical measurements within normal limits will improve Outcome: Progressing   Problem: Coping: Goal: Level of anxiety will decrease Outcome: Not Progressing   Problem: Skin Integrity: Goal: Risk for impaired skin integrity will decrease Outcome: Progressing   Problem: Safety: Goal: Non-violent Restraint(s) Outcome: Not Progressing

## 2023-05-30 NOTE — Progress Notes (Addendum)
OT Cancellation Note  Patient Details Name: Gregory Butler MRN: [id number] DOB: July 20, 1932   Cancelled Treatment:     Per conversation with assigned PT who spoke with pt's nurse, the pt has been with increased confusion, agitation, and occasionally aggressive behaviors today. Will hold and attempt eval at later date, as appropriate.     Reuben Likes, OTR/L 05/30/2023, 5:10 PM

## 2023-05-30 NOTE — Progress Notes (Addendum)
SLP Cancellation Note  Patient Details Name: Gregory Butler MRN: [id number] DOB: [birth year]  1506 pt continues to be lethargic, will continue efforts next date.      Cancelled treatment:       Reason Eval/Treat Not Completed: Other (comment);Patient's level of consciousness (pt currently sleeping after very difficult am, kicking toward staff and agitated per RN, will continue efforts) [name], MS [facility] SLP Acute Rehab Services Office [phone]   Chales Abrahams 05/30/2023, 10:30 AM

## 2023-05-30 NOTE — Progress Notes (Signed)
PT Cancellation Note  Patient Details Name: Gregory Butler MRN: [id number] DOB: Apr 20, 1932   Cancelled Treatment:    Reason Eval/Treat Not Completed: Fatigue/lethargy limiting ability to participate with pt sleeping following a difficult early am with pt demonstrating perceived behaviors including kicking, biting and spitting at staff. PT to continue to follow acutely and return for evaluation as time allows.   Johnny Bridge, PT Acute Rehab  Jacqualyn Posey 05/30/2023, 11:19 AM

## 2023-05-30 NOTE — Progress Notes (Signed)
Progress Note    Gregory Butler   WJX:[id number]  DOB: [birth year]  DOA: 05/26/2023     3 PCP: Gregory Lean, PA-C  Initial CC: AMS  Hospital Course: Gregory Butler is an 89 yo male with PMH afib, HTN, HLD.  Over the past month prior to hospitalization patient has become progressively disoriented and functionally declining.  He has been having decreased appetite and worsened confusion. On workup he was found to have hypercalcemia, hypotension, and worsened encephalopathy.  He was started on fluids, Zometa, and underwent further workup. Imaging studies showed a cavitary mass involving the right upper lobe measuring 4.5 x 3.3 cm.  There was also suspected liver metastasis involving left hepatic lobe measuring 5.6 x 6.5 cm.  Interval History:  Patient was restless and agitated overnight.  He required a dose of Ativan and Haldol.  This morning he was very lethargic and minimally interactive.  Assessment and Plan:  RUL mass Liver metastasis  -CT shows cavitary mass in RUL measuring 4.5 x 3.3 cm and left hepatic lobe mass measuring 5.6 x 6.5 cm - Presumed diagnosis is primary lung cancer with liver metastasis - Tentative plan for liver biopsy on 05/31/2023 -Given significant and rapid functional decline in this clinical context, his prognosis appear poor; patient and family should continue to discuss comfort care/hospice route at this time  Goals of Care - Overall his prognosis remains poor - tentative biopsy 7/18 which may help answer question of diagnosis to allow family acceptance and pursuit of hospice; he would be an extremely poor treatment candidate   Hypercalcemia - Suspected due to underlying malignancy - Follow-up PTH RP - S/p zoledronic acid and fluids  Acute Metabolic Encephalopathy Delirium - continue haldol and ativan PRN - update EKG -Continue delirium precautions - Hypercalcemia has been treated   Dysphagia - continue dys 1 diet   Hypotension  Lactic  Acidosis Suspect related to hypovolemia with poor PO intake and hypercalcemia - did not tolerate dilt gtt    Hypomagnesemia  Hypophosphatemia Hypokalemia - replete as needed   Atrial Fibrillation with RVR Metoprolol on hold Echo -> EF 70-75%, no RWMA, severely elevated PASP, moderate to severe AVS, dilated IVC with <50% resp variability Hold anticoagulation for now - stop dilt gtt due to bradycardia and  hypoTN - will resume IV lopressor as needed  AKI - baseline creatinine ~ 1 - patient presents with increase in creat >0.3 mg/dL above baseline or creat increase >1.5x baseline presumed to have occurred within past 7 days PTA - creat 1.51 on admission; resolved with IVF   Prediabtetes - diet control    Dyslipidemia - no further mortality benefit with statin use    Intermediate Density lesions off R Kidney Hemorrhagic or proteinaceous cysts?   Old records reviewed in assessment of this patient  Antimicrobials:   DVT prophylaxis:  heparin injection 5,000 Units Start: 05/27/23 1000   Code Status:   Code Status: Full Code  Mobility Assessment (Last 72 Hours)     Mobility Assessment     Row Name 05/29/23 1955 05/29/23 0856 05/28/23 2258 05/28/23 0800 05/28/23 0026   Does patient have an order for bedrest or is patient medically unstable No - Continue assessment No - Continue assessment No - Continue assessment No - Continue assessment No - Continue assessment   What is the highest level of mobility based on the progressive mobility assessment? Level 1 (Bedfast) - Unable to balance while sitting on edge of bed Level 1 (Bedfast) - Unable to  balance while sitting on edge of bed Level 1 (Bedfast) - Unable to balance while sitting on edge of bed Level 1 (Bedfast) - Unable to balance while sitting on edge of bed Level 1 (Bedfast) - Unable to balance while sitting on edge of bed   Is the above level different from baseline mobility prior to current illness? Yes - Recommend PT  order -- -- Yes - Recommend PT order --            Barriers to discharge: none Disposition Plan:  TBD Status is: Inpt  Objective: Blood pressure 129/68, pulse 63, temperature 97.6 F (36.4 C), temperature source Oral, resp. rate 20, height 5\' 8"  (1.727 m), weight 73.1 kg, SpO2 97%.  Examination:  Physical Exam Constitutional:      Comments: Chronically ill-appearing elderly gentleman lying in bed in no distress  HENT:     Head: Normocephalic and atraumatic.     Mouth/Throat:     Mouth: Mucous membranes are moist.  Eyes:     Pupils: Pupils are equal, round, and reactive to light.  Cardiovascular:     Rate and Rhythm: Normal rate. Rhythm irregular.  Pulmonary:     Effort: Pulmonary effort is normal. No respiratory distress.     Breath sounds: Normal breath sounds. No wheezing.  Abdominal:     General: Bowel sounds are normal. There is no distension.     Palpations: Abdomen is soft.     Tenderness: There is no abdominal tenderness.  Musculoskeletal:        General: Normal range of motion.     Cervical back: Normal range of motion and neck supple.  Skin:    General: Skin is warm and dry.  Neurological:     Comments: Unable to to follow commands.  Nonverbal and noninteractive for me this morning      Consultants:    Procedures:    Data Reviewed: Results for orders placed or performed during the hospital encounter of 05/26/23 (from the past 24 hour(s))  Comprehensive metabolic panel     Status: Abnormal   Collection Time: 05/30/23  4:13 AM  Result Value Ref Range   Sodium 137 135 - 145 mmol/L   Potassium 3.6 3.5 - 5.1 mmol/L   Chloride 110 98 - 111 mmol/L   CO2 20 (L) 22 - 32 mmol/L   Glucose, Bld 119 (H) 70 - 99 mg/dL   BUN 11 8 - 23 mg/dL   Creatinine, Ser 6.21 0.61 - 1.24 mg/dL   Calcium 9.4 8.9 - 30.8 mg/dL   Total Protein 5.6 (L) 6.5 - 8.1 g/dL   Albumin 2.4 (L) 3.5 - 5.0 g/dL   AST 27 15 - 41 U/L   ALT 17 0 - 44 U/L   Alkaline Phosphatase 70 38 - 126  U/L   Total Bilirubin 0.6 0.3 - 1.2 mg/dL   GFR, Estimated >65 >78 mL/min   Anion gap 7 5 - 15  CBC with Differential/Platelet     Status: Abnormal   Collection Time: 05/30/23  4:13 AM  Result Value Ref Range   WBC 8.6 4.0 - 10.5 K/uL   RBC 3.56 (L) 4.22 - 5.81 MIL/uL   Hemoglobin 10.3 (L) 13.0 - 17.0 g/dL   HCT 46.9 (L) 62.9 - 52.8 %   MCV 89.3 80.0 - 100.0 fL   MCH 28.9 26.0 - 34.0 pg   MCHC 32.4 30.0 - 36.0 g/dL   RDW 41.3 24.4 - 01.0 %   Platelets 250  150 - 400 K/uL   nRBC 0.0 0.0 - 0.2 %   Neutrophils Relative % 83 %   Neutro Abs 7.1 1.7 - 7.7 K/uL   Lymphocytes Relative 9 %   Lymphs Abs 0.8 0.7 - 4.0 K/uL   Monocytes Relative 6 %   Monocytes Absolute 0.5 0.1 - 1.0 K/uL   Eosinophils Relative 2 %   Eosinophils Absolute 0.2 0.0 - 0.5 K/uL   Basophils Relative 0 %   Basophils Absolute 0.0 0.0 - 0.1 K/uL   Immature Granulocytes 0 %   Abs Immature Granulocytes 0.01 0.00 - 0.07 K/uL  Magnesium     Status: None   Collection Time: 05/30/23  4:13 AM  Result Value Ref Range   Magnesium 1.8 1.7 - 2.4 mg/dL  Phosphorus     Status: Abnormal   Collection Time: 05/30/23  4:13 AM  Result Value Ref Range   Phosphorus 1.6 (L) 2.5 - 4.6 mg/dL    I have reviewed pertinent nursing notes, vitals, labs, and images as necessary. I have ordered labwork to follow up on as indicated.  I have reviewed the last notes from staff over past 24 hours. I have discussed patient's care plan and test results with nursing staff, CM/SW, and other staff as appropriate.  Time spent: Greater than 50% of the 55 minute visit was spent in counseling/coordination of care for the patient as laid out in the A&P.   LOS: 3 days   Lewie Chamber, MD [facility] 05/30/2023, 5:02 PM

## 2023-05-30 NOTE — Progress Notes (Addendum)
Patient remains alert with confusion, more combative this a.m. AEB, kicking at staff as well as biting at staff and spitting, staff left room in hopes that patient would calm down(from outside of patients room he is yelling out different probable family member names and making swinging gestures and kicking as well and when doing so patient referenced that they drunk all the liquor NP covering unit made aware.

## 2023-05-30 NOTE — Hospital Course (Addendum)
Gregory Butler is an 89 yo male with PMH afib, HTN, HLD.  Over the past month prior to hospitalization patient has become progressively disoriented and functionally declining.  He has been having decreased appetite and worsened confusion. On workup he was found to have hypercalcemia, hypotension, and worsened encephalopathy.  He was started on fluids, Zometa, and underwent further workup. Imaging studies showed a cavitary mass involving the right upper lobe measuring 4.5 x 3.3 cm.  There was also suspected liver metastasis involving left hepatic lobe measuring 5.6 x 6.5 cm. See below for further A&P.

## 2023-05-31 ENCOUNTER — Inpatient Hospital Stay (HOSPITAL_COMMUNITY): Payer: Medicare Other

## 2023-05-31 DIAGNOSIS — C3411 Malignant neoplasm of upper lobe, right bronchus or lung: Secondary | ICD-10-CM

## 2023-05-31 DIAGNOSIS — C787 Secondary malignant neoplasm of liver and intrahepatic bile duct: Secondary | ICD-10-CM | POA: Diagnosis not present

## 2023-05-31 LAB — CULTURE, BLOOD (ROUTINE X 2)
Culture: NO GROWTH
Culture: NO GROWTH
Special Requests: ADEQUATE
Special Requests: ADEQUATE

## 2023-05-31 LAB — BASIC METABOLIC PANEL
Anion gap: 9 (ref 5–15)
BUN: 9 mg/dL (ref 8–23)
CO2: 19 mmol/L — ABNORMAL LOW (ref 22–32)
Calcium: 9.2 mg/dL (ref 8.9–10.3)
Chloride: 109 mmol/L (ref 98–111)
Creatinine, Ser: 0.94 mg/dL (ref 0.61–1.24)
GFR, Estimated: >60 mL/min (ref >60)
Glucose, Bld: 126 mg/dL — ABNORMAL HIGH (ref 70–99)
Potassium: 4.3 mmol/L (ref 3.5–5.1)
Sodium: 137 mmol/L (ref 135–145)

## 2023-05-31 LAB — CBC WITH DIFFERENTIAL/PLATELET
Abs Immature Granulocytes: 0.03 10*3/uL (ref 0.00–0.07)
Basophils Absolute: 0.1 10*3/uL (ref 0.0–0.1)
Basophils Relative: 1 %
Eosinophils Absolute: 0.2 10*3/uL (ref 0.0–0.5)
Eosinophils Relative: 2 %
HCT: 36.5 % — ABNORMAL LOW (ref 39.0–52.0)
Hemoglobin: 11.7 g/dL — ABNORMAL LOW (ref 13.0–17.0)
Immature Granulocytes: 0 %
Lymphocytes Relative: 11 %
Lymphs Abs: 1 10*3/uL (ref 0.7–4.0)
MCH: 28.3 pg (ref 26.0–34.0)
MCHC: 32.1 g/dL (ref 30.0–36.0)
MCV: 88.2 fL (ref 80.0–100.0)
Monocytes Absolute: 0.7 10*3/uL (ref 0.1–1.0)
Monocytes Relative: 8 %
Neutro Abs: 7.1 10*3/uL (ref 1.7–7.7)
Neutrophils Relative %: 78 %
Platelets: 281 10*3/uL (ref 150–400)
RBC: 4.14 MIL/uL — ABNORMAL LOW (ref 4.22–5.81)
RDW: 15.5 % (ref 11.5–15.5)
WBC: 9 10*3/uL (ref 4.0–10.5)
nRBC: 0 % (ref 0.0–0.2)

## 2023-05-31 LAB — MAGNESIUM: Magnesium: 1.9 mg/dL (ref 1.7–2.4)

## 2023-05-31 LAB — PHOSPHORUS: Phosphorus: 3.7 mg/dL (ref 2.5–4.6)

## 2023-05-31 MED ORDER — FENTANYL CITRATE (PF) 100 MCG/2ML IJ SOLN
INTRAMUSCULAR | Status: AC
  Filled 2023-05-31: qty 2

## 2023-05-31 MED ORDER — GELATIN ABSORBABLE 12-7 MM EX MISC
CUTANEOUS | Status: AC
  Filled 2023-05-31: qty 1

## 2023-05-31 MED ORDER — MIDAZOLAM HCL 2 MG/2ML IJ SOLN
INTRAMUSCULAR | Status: AC
  Filled 2023-05-31: qty 2

## 2023-05-31 MED ORDER — METOPROLOL TARTRATE 5 MG/5ML IV SOLN
5.0000 mg | INTRAVENOUS | Status: DC | PRN
  Administered 2023-05-31 (×2): 5 mg via INTRAVENOUS
  Filled 2023-05-31 (×3): qty 5

## 2023-05-31 MED ORDER — LIDOCAINE HCL 1 % IJ SOLN
INTRAMUSCULAR | Status: AC
  Filled 2023-05-31: qty 20

## 2023-05-31 MED ORDER — LORAZEPAM 2 MG/ML IJ SOLN
1.0000 mg | INTRAMUSCULAR | Status: DC | PRN
  Administered 2023-06-01 – 2023-06-03 (×2): 1 mg via INTRAVENOUS
  Filled 2023-05-31 (×2): qty 1

## 2023-05-31 NOTE — Procedures (Signed)
Interventional Radiology Procedure Note  Procedure: US Guided Biopsy of left lobe liver mass  Complications: None  Estimated Blood Loss: < 10 mL  Findings: 18 G core biopsy of large left lobe liver mass performed under US guidance.  Two core samples obtained and sent to Pathology.  Jodi Marble. Fredia Sorrow, M.D Pager:  [phone]

## 2023-05-31 NOTE — Progress Notes (Signed)
Progress Note    Gregory Butler   OZH:[id number]  DOB: [birth year]  DOA: 05/26/2023     4 PCP: Chauncy Lean, PA-C  Initial CC: AMS  Hospital Course: Gregory Butler is an 89 yo male with PMH afib, HTN, HLD.  Over the past month prior to hospitalization patient has become progressively disoriented and functionally declining.  He has been having decreased appetite and worsened confusion. On workup he was found to have hypercalcemia, hypotension, and worsened encephalopathy.  He was started on fluids, Zometa, and underwent further workup. Imaging studies showed a cavitary mass involving the right upper lobe measuring 4.5 x 3.3 cm.  There was also suspected liver metastasis involving left hepatic lobe measuring 5.6 x 6.5 cm.  Interval History:  No acute events overnight. Spoke with son and daughter on phone this morning to give update and discuss more GOC. They will also be coming to hospital this afternoon as well.  Patient was comfortable in bed with mittens on. Mumbling and talking some; could understand some. Denied any pains. Still a little agitated appearing at times but settles down quickly.   Assessment and Plan:  RUL mass Liver metastasis  -CT shows cavitary mass in RUL measuring 4.5 x 3.3 cm and left hepatic lobe mass measuring 5.6 x 6.5 cm - Presumed diagnosis is primary lung cancer with liver metastasis - Tentative plan for liver biopsy on 05/31/2023 -Given significant and rapid functional decline in this clinical context, his prognosis remains poor - I had some GOC talks on the phone with son and daughter this morning (7/18) and will continue more when they come to hospital; they still wish to pursue tissue diagnosis via biopsy if possible  Goals of Care - Overall his prognosis remains poor - tentative biopsy 7/18 which may help answer question of diagnosis to allow family acceptance and pursuit of hospice; he would be an extremely poor treatment candidate and family understands  this - we will further discuss code status today as well   Hypercalcemia - resolved  - Suspected due to underlying malignancy - Follow-up PTH RP - S/p zoledronic acid and fluids  Acute Metabolic Encephalopathy Delirium - continue haldol and ativan PRN -Continue delirium precautions - Hypercalcemia has been treated   Dysphagia - continue dys 1 diet   Hypotension  Lactic Acidosis Suspect related to hypovolemia with poor PO intake and hypercalcemia - did not tolerate dilt gtt    Hypomagnesemia  Hypophosphatemia Hypokalemia - replete as needed   Atrial Fibrillation with RVR Echo -> EF 70-75%, no RWMA, severely elevated PASP, moderate to severe AVS, dilated IVC with <50% resp variability Hold anticoagulation for now - stop dilt gtt due to bradycardia and  hypoTN - IV lopressor PRN  AKI - baseline creatinine ~ 1 - patient presents with increase in creat >0.3 mg/dL above baseline or creat increase >1.5x baseline presumed to have occurred within past 7 days PTA - creat 1.51 on admission; resolved with IVF   Prediabtetes - diet control    Dyslipidemia - no further mortality benefit with statin use    Intermediate Density lesions off R Kidney Hemorrhagic or proteinaceous cysts?   Old records reviewed in assessment of this patient  Antimicrobials:   DVT prophylaxis:  heparin injection 5,000 Units Start: 05/27/23 1000   Code Status:   Code Status: Full Code  Mobility Assessment (Last 72 Hours)   Mobility Assessment  Row Name 05/30/23 2145 05/29/23 1955 05/29/23 0856 05/28/23 2258 Does patient have an order for bedrest  or is patient medically unstable No - Continue assessment No - Continue assessment No - Continue assessment No - Continue assessment What is the highest level of mobility based on the progressive mobility assessment? Level 2 (Chairfast) - Balance while sitting on edge of bed and cannot stand Level 1 (Bedfast) - Unable to balance while  sitting on edge of bed Level 1 (Bedfast) - Unable to balance while sitting on edge of bed Level 1 (Bedfast) - Unable to balance while sitting on edge of bed Is the above level different from baseline mobility prior to current illness? Yes - Recommend PT order Yes - Recommend PT order -- --   Mobility Assessment (Last 72 Hours)   Mobility Assessment  Row Name 05/30/23 2145 05/29/23 1955 05/29/23 0856 05/28/23 2258 Does patient have an order for bedrest or is patient medically unstable No - Continue assessment No - Continue assessment No - Continue assessment No - Continue assessment What is the highest level of mobility based on the progressive mobility assessment? Level 2 (Chairfast) - Balance while sitting on edge of bed and cannot stand Level 1 (Bedfast) - Unable to balance while sitting on edge of bed Level 1 (Bedfast) - Unable to balance while sitting on edge of bed Level 1 (Bedfast) - Unable to balance while sitting on edge of bed Is the above level different from baseline mobility prior to current illness? Yes - Recommend PT order Yes - Recommend PT order -- --    Barriers to discharge: none Disposition Plan:  TBD Status is: Inpt  Objective: Blood pressure (!) 148/67, pulse 94, temperature 97.7 F (36.5 C), temperature source Oral, resp. rate 19, height 5\' 8"  (1.727 m), weight 73.1 kg, SpO2 91%.  Examination:  Physical Exam Constitutional:      Comments: Chronically ill-appearing elderly gentleman lying in bed in no distress  HENT:     Head: Normocephalic and atraumatic.     Mouth/Throat:     Mouth: Mucous membranes are moist.  Eyes:     Pupils: Pupils are equal, round, and reactive to light.  Cardiovascular:     Rate and Rhythm: Normal rate. Rhythm irregular.  Pulmonary:     Effort: Pulmonary effort is normal. No respiratory distress.     Breath sounds: Normal breath sounds. No wheezing.  Abdominal:     General: Bowel sounds are normal. There is no  distension.     Palpations: Abdomen is soft.     Tenderness: There is no abdominal tenderness.  Musculoskeletal:        General: Normal range of motion.     Cervical back: Normal range of motion and neck supple.  Skin:    General: Skin is warm and dry.  Neurological:     Comments: Moves all 4 extremities and follows commands      Consultants:    Procedures:    Data Reviewed: Results for orders placed or performed during the hospital encounter of 05/26/23 (from the past 24 hour(s)) Basic metabolic panel     Status: Abnormal Collection Time: 05/31/23  4:38 AM Result Value Ref Range Sodium 137 135 - 145 mmol/L Potassium 4.3 3.5 - 5.1 mmol/L Chloride 109 98 - 111 mmol/L CO2 19 (L) 22 - 32 mmol/L Glucose, Bld 126 (H) 70 - 99 mg/dL BUN 9 8 - 23 mg/dL Creatinine, Ser 6.43 3.29 - 1.24 mg/dL Calcium 9.2 8.9 - 51.8 mg/dL GFR, Estimated >84 >16 mL/min Anion gap 9 5 - 15 CBC with Differential/Platelet  Status: Abnormal Collection Time: 05/31/23  4:38 AM Result Value Ref Range WBC 9.0 4.0 - 10.5 K/uL RBC 4.14 (L) 4.22 - 5.81 MIL/uL Hemoglobin 11.7 (L) 13.0 - 17.0 g/dL HCT 08.6 (L) 57.8 - 46.9 % MCV 88.2 80.0 - 100.0 fL MCH 28.3 26.0 - 34.0 pg MCHC 32.1 30.0 - 36.0 g/dL RDW 62.9 52.8 - 41.3 % Platelets 281 150 - 400 K/uL nRBC 0.0 0.0 - 0.2 % Neutrophils Relative % 78 % Neutro Abs 7.1 1.7 - 7.7 K/uL Lymphocytes Relative 11 % Lymphs Abs 1.0 0.7 - 4.0 K/uL Monocytes Relative 8 % Monocytes Absolute 0.7 0.1 - 1.0 K/uL Eosinophils Relative 2 % Eosinophils Absolute 0.2 0.0 - 0.5 K/uL Basophils Relative 1 % Basophils Absolute 0.1 0.0 - 0.1 K/uL Immature Granulocytes 0 % Abs Immature Granulocytes 0.03 0.00 - 0.07 K/uL Magnesium     Status: None Collection Time: 05/31/23  4:38 AM Result Value Ref Range Magnesium 1.9 1.7 - 2.4 mg/dL Phosphorus     Status: None Collection Time: 05/31/23  4:38  AM Result Value Ref Range Phosphorus 3.7 2.5 - 4.6 mg/dL   I have reviewed pertinent nursing notes, vitals, labs, and images as necessary. I have ordered labwork to follow up on as indicated.  I have reviewed the last notes from staff over past 24 hours. I have discussed patient's care plan and test results with nursing staff, CM/SW, and other staff as appropriate.  Time spent: Greater than 50% of the 55 minute visit was spent in counseling/coordination of care for the patient as laid out in the A&P.   LOS: 4 days   Lewie Chamber, MD [facility] 05/31/2023, 2:07 PM

## 2023-05-31 NOTE — Progress Notes (Signed)
SLP Cancellation Note  Patient Details Name: Gregory Butler MRN: [id number] DOB: March 02, 1932   Cancelled treatment:       Reason Eval/Treat Not Completed: Other (comment) (pt npo for IR procedure reportedly to be done this pm; will continue efforts)   Chales Abrahams 05/31/2023, 10:41 AM  [name], MS [facility] SLP Acute Rehab Services Office [phone]

## 2023-05-31 NOTE — Progress Notes (Signed)
PT Cancellation Note  Patient Details Name: Gregory Butler MRN: [id number] DOB: [birth year]   Cancelled Treatment:    Reason Eval/Treat Not Completed: Other (comment); noted poor prognosis per MD notes, possible comfort care; will defer PT at this time and await GOC.  Delice Bison, PT  Acute Rehab Dept [facility]) [phone]  05/31/2023  [facility] 05/31/2023, 9:11 AM

## 2023-05-31 NOTE — Sedation Documentation (Signed)
No sedation given.

## 2023-05-31 NOTE — Plan of Care (Signed)
  Problem: Education: Goal: Knowledge of General Education information will improve Description: Including pain rating scale, medication(s)/side effects and non-pharmacologic comfort measures Outcome: Not Progressing   Problem: Health Behavior/Discharge Planning: Goal: Ability to manage health-related needs will improve Outcome: Not Progressing   Problem: Coping: Goal: Level of anxiety will decrease Outcome: Not Progressing   Problem: Pain Managment: Goal: General experience of comfort will improve Outcome: Progressing   Problem: Safety: Goal: Ability to remain free from injury will improve Outcome: Progressing   Problem: Skin Integrity: Goal: Risk for impaired skin integrity will decrease Outcome: Progressing   Problem: Safety: Goal: Non-violent Restraint(s) Outcome: Not Progressing

## 2023-05-31 NOTE — Progress Notes (Signed)
OT Cancellation Note  Patient Details Name: Zayin Valadez MRN: [id number] DOB: 1932/06/13   Cancelled Treatment:    Reason Eval/Treat Not Completed: Other (comment). Pt's prognosis is poor. Hospice is being recommended to family. Pt is not an appropriate candidate for OT services. Will cancel consult. Please re-order OT if pt's status improves.   Limmie Patricia, OTR/L,CBIS  Supplemental OT - [facility] Secure [facility] Preferred   05/31/2023, 1:54 PM

## 2023-05-31 NOTE — Plan of Care (Signed)
  Problem: Health Behavior/Discharge Planning: Goal: Ability to manage health-related needs will improve Outcome: Progressing   Problem: Elimination: Goal: Will not experience complications related to urinary retention Outcome: Progressing   Problem: Pain Managment: Goal: General experience of comfort will improve Outcome: Progressing   

## 2023-05-31 NOTE — Progress Notes (Signed)
Speech Language Pathology Discharge Patient Details Name: Gregory Butler MRN: [id number] DOB: [birth year] Today's Date: 05/31/2023 Time:  -     Patient discharged from SLP services secondary to  MD request to cancel  .  Please see latest therapy progress note for current level of functioning and progress toward goals.    Progress and discharge plan discussed with patient and/or caregiver:  per MD request, did not discuss with family   [name], MS [facility] SLP Acute Rehab Services Office [phone]    Chales Abrahams 05/31/2023, 3:10 PM

## 2023-05-31 NOTE — Progress Notes (Signed)
PT Cancellation Note  Patient Details Name: Gregory Butler MRN: [id number] DOB: 1932-08-25   Cancelled Treatment:    Reason Eval/Treat Not Completed: PT screened, no needs identified, will sign off--planning for hospice, ok to sign off per MD, will re-order if anything should change;    [facility] 05/31/2023, 1:56 PM

## 2023-06-01 DIAGNOSIS — C3411 Malignant neoplasm of upper lobe, right bronchus or lung: Secondary | ICD-10-CM | POA: Diagnosis not present

## 2023-06-01 DIAGNOSIS — C787 Secondary malignant neoplasm of liver and intrahepatic bile duct: Secondary | ICD-10-CM | POA: Diagnosis not present

## 2023-06-01 NOTE — Progress Notes (Signed)
   Referral was received from [place] with [facility] for hospice services at home. I have been able to connect with the daughter Mycah Formica who is the primary caregiver and lives in home with pt. She was introduced to hospice care and how we would be able to assist with pt care in the home based on the interdisciplinary team approach. She is unsure if they are ready for hospice at this time due to not having biopsy results back and unsure if the pt will be eligible for any kind of treatment options.   She agrees for me to follow along while in the hospital to assist with any questions regarding our services and to see if our services we will be needed at d/c time.   Norm Parcel RN [phone]

## 2023-06-01 NOTE — TOC Progression Note (Signed)
Transition of Care [facility]) - Progression Note    Patient Details  Name: Gregory Butler MRN: [id number] Date of Birth: 1932-02-12  [facility]) CM/SW Contact  Larrie Kass, LCSW Phone Number: 06/01/2023, 1:39 PM  Clinical Narrative:    CSW received a consult for hospice services. CSW met with pt and family to offer choice of a hospice agency. Pt and family have chosen [facility]. Referral was sent, pt's daughter is requesting a hospital if pt is to d/c home. TOC to follow for d/c needs.    Expected Discharge Plan: Home w Hospice Care Barriers to Discharge: Continued Medical Work up  Expected Discharge Plan and Services In-house Referral: Clinical Social Work     Living arrangements for the past 2 months: Single Family Home                                       Social Determinants of Health (SDOH) Interventions SDOH Screenings   Food Insecurity: No Food Insecurity (05/27/2023)  Housing: Low Risk  (05/27/2023)  Transportation Needs: No Transportation Needs (05/27/2023)  Utilities: Not At Risk (05/27/2023)  Tobacco Use: Medium Risk (05/27/2023)    Readmission Risk Interventions     No data to display

## 2023-06-01 NOTE — Consult Note (Signed)
[facility] CONSULT NOTE  Patient Care Team: Chauncy Lean, PA-C as PCP - General (Internal Medicine)  CHIEF COMPLAINTS/PURPOSE OF CONSULTATION:  Metastatic carcinoma  HISTORY OF PRESENTING ILLNESS:  Gregory Butler 89 y.o. male is here because of recent diagnosis of metastatic carcinoma.  Patient presented to the hospital with disorientation and declining performance status.  He has been losing profound amount of weight for the past 3 months.  When he was admitted he was found to have hypercalcemia which explains his change in mentation.  He also had CT scans that revealed a right upper lobe mass measuring 4.5 cm and liver metastases measuring 6.5 cm.  He underwent a liver biopsy and pathology is pending.  Because of his worsening performance status inpatient team recommended hospice care but they wanted to chat with oncology to discuss if that is the right approach.  Patient is not able to answer any questions and I spoke with patient's son and daughter.  MEDICAL HISTORY:  Past Medical History:  Diagnosis Date   Aneurysm artery, iliac (HCC)    Arthritis    Diabetes mellitus without complication (HCC)    GERD (gastroesophageal reflux disease)    Hyperlipemia    Hypertension     SURGICAL HISTORY: Past Surgical History:  Procedure Laterality Date   CHOLECYSTECTOMY     TUMOR EXCISION Right    lower right abdominal    SOCIAL HISTORY: Social History   Socioeconomic History   Marital status: Single    Spouse name: Not on file   Number of children: Not on file   Years of education: Not on file   Highest education level: Not on file  Occupational History   Not on file  Tobacco Use   Smoking status: Former    Current packs/day: 0.00    Types: Cigarettes    Quit date: 11/29/2011    Years since quitting: 11.5   Smokeless tobacco: Former    Quit date: 11/28/1953  Substance and Sexual Activity   Alcohol use: No    Alcohol/week: 0.0 standard drinks of alcohol    Drug use: No   Sexual activity: Not on file  Other Topics Concern   Not on file  Social History Narrative   Not on file   Social Determinants of Health   Financial Resource Strain: Not on file  Food Insecurity: No Food Insecurity (05/27/2023)   Hunger Vital Sign    Worried About Running Out of Food in the Last Year: Never true    Ran Out of Food in the Last Year: Never true  Transportation Needs: No Transportation Needs (05/27/2023)   PRAPARE - Administrator, Civil Service (Medical): No    Lack of Transportation (Non-Medical): No  Physical Activity: Not on file  Stress: Not on file  Social Connections: Not on file  Intimate Partner Violence: Not At Risk (05/27/2023)   Humiliation, Afraid, Rape, and Kick questionnaire    Fear of Current or Ex-Partner: No    Emotionally Abused: No    Physically Abused: No    Sexually Abused: No    FAMILY HISTORY: Family History  Problem Relation Age of Onset   Heart disease Mother    Heart disease Father     ALLERGIES:  has No Known Allergies.  MEDICATIONS:  Current Facility-Administered Medications  Medication Dose Route Frequency Provider Last Rate Last Admin   acetaminophen (TYLENOL) tablet 650 mg  650 mg Oral Q6H PRN Gery Pray, MD  Or   acetaminophen (TYLENOL) suppository 650 mg  650 mg Rectal Q6H PRN Crosley, Debby, MD       haloperidol lactate (HALDOL) injection 5 mg  5 mg Intravenous Q6H PRN Crosley, Debby, MD   5 mg at 05/30/23 2337   LORazepam (ATIVAN) injection 1 mg  1 mg Intravenous Q4H PRN Lewie Chamber, MD   1 mg at 06/01/23 0049   metoprolol tartrate (LOPRESSOR) injection 5 mg  5 mg Intravenous Q2H PRN Lewie Chamber, MD   5 mg at 05/31/23 2236   ondansetron (ZOFRAN) tablet 4 mg  4 mg Oral Q6H PRN Crosley, Debby, MD       Or   ondansetron (ZOFRAN) injection 4 mg  4 mg Intravenous Q6H PRN Crosley, Debby, MD       senna-docusate (Senokot-S) tablet 1 tablet  1 tablet Oral QHS PRN Gery Pray, MD         REVIEW OF SYSTEMS:   Patient is awake but not answering any questions.  He is extremely frail All other systems were reviewed with the patient and are negative.  PHYSICAL EXAMINATION: ECOG PERFORMANCE STATUS: 4 - Bedbound  Vitals:   06/01/23 0049 06/01/23 0639  BP: 138/79 125/63  Pulse: 60 77  Resp:    Temp:  98.4 F (36.9 C)  SpO2:  100%   Filed Weights   05/26/23 1725 05/28/23 0745  Weight: 216 lb 0.8 oz (98 kg) 161 lb 2.5 oz (73.1 kg)      LABORATORY DATA:  I have reviewed the data as listed Lab Results  Component Value Date   WBC 9.0 05/31/2023   HGB 11.7 (L) 05/31/2023   HCT 36.5 (L) 05/31/2023   MCV 88.2 05/31/2023   PLT 281 05/31/2023   Lab Results  Component Value Date   NA 137 05/31/2023   K 4.3 05/31/2023   CL 109 05/31/2023   CO2 19 (L) 05/31/2023    RADIOGRAPHIC STUDIES: I have personally reviewed the radiological reports and agreed with the findings in the report.  ASSESSMENT AND PLAN:  Metastatic carcinoma most likely lung primary with liver metastases: I discussed with the patient's family that his performance status is extremely poor and hence has very poor prognosis. There is no treatment that could prolong his life or prevent any clinical relief.  He is not a candidate for any systemic treatments.  Recommendation: Hospice care. I informed the inpatient team who will arrange for the hospice consultation. Thank you very much for involving [place] in his care.   All questions were answered. The patient knows to call the clinic with any problems, questions or concerns.    Tamsen Meek, MD @T @

## 2023-06-01 NOTE — Progress Notes (Signed)
Progress Note    Gregory Butler   Gregory Butler:[id number]  DOB: [birth year]  DOA: 05/26/2023     5 PCP: Chauncy Lean, PA-C  Initial CC: AMS  Hospital Course: [name] is an 89 yo male with PMH afib, HTN, HLD.  Over the past month prior to hospitalization patient has become progressively disoriented and functionally declining.  He has been having decreased appetite and worsened confusion. On workup he was found to have hypercalcemia, hypotension, and worsened encephalopathy.  He was started on fluids, Zometa, and underwent further workup. Imaging studies showed a cavitary mass involving the right upper lobe measuring 4.5 x 3.3 cm.  There was also suspected liver metastasis involving left hepatic lobe measuring 5.6 x 6.5 cm.  Interval History:  No acute events overnight.  Patient remains confused but less agitated.  Mittens still in place. Met with family bedside today including son and daughter.  Further goals of care discussed and they were amenable with transitioning to DNR. They were not comfortable with transition to hospice yet and after talking with liaison, it appears that they still wish to wait on biopsy results and/or talk to oncology regarding treatment options.  Assessment and Plan:  RUL mass Liver metastasis  -CT shows cavitary mass in RUL measuring 4.5 x 3.3 cm and left hepatic lobe mass measuring 5.6 x 6.5 cm - Presumed diagnosis is primary lung cancer with liver metastasis - Liver biopsy performed on 05/31/2023.  Will take several days for results -Given significant and rapid functional decline in this clinical context, his prognosis remains poor - GOC talks continue; met with son and daughter today, 7/19 bedside. Explained suspected diagnosis and poor treatment candidacy; after they spoke with hospice liaison they seem to be hesitant for going home with hospice yet without talking to oncology - I will therefore ask for oncology input however we do not yet have biopsy results  back to definitely help guide conversations   Goals of Care - Overall his prognosis remains poor - s/p biopsy 7/18 which may help answer question of diagnosis to allow family acceptance and pursuit of hospice; he would be an extremely poor treatment candidate and family understands this but still hesitant; follow up oncology evaluation - son and daughter have elected for DNR but not ready for comfort care/hospice yet   Hypercalcemia - resolved  - Suspected due to underlying malignancy - Follow-up PTH-rp - S/p zoledronic acid and fluids  Acute Metabolic Encephalopathy Delirium - continue haldol and ativan PRN -Continue delirium precautions - Hypercalcemia has been treated   Dysphagia - continue CLD; will try for Dys 1 diet if able too    Hypotension  Lactic Acidosis Suspect related to hypovolemia with poor PO intake and hypercalcemia - did not tolerate dilt gtt    Hypomagnesemia  Hypophosphatemia Hypokalemia - replete as needed   Atrial Fibrillation with RVR Echo -> EF 70-75%, no RWMA, severely elevated PASP, moderate to severe AVS, dilated IVC with <50% resp variability Hold anticoagulation for now - stop dilt gtt due to bradycardia and  hypoTN - IV lopressor PRN  AKI - baseline creatinine ~ 1 - patient presents with increase in creat >0.3 mg/dL above baseline or creat increase >1.5x baseline presumed to have occurred within past 7 days PTA - creat 1.51 on admission; resolved with IVF   Prediabtetes - diet control    Dyslipidemia - no further mortality benefit with statin use    Intermediate Density lesions off R Kidney Hemorrhagic or proteinaceous cysts?  Old records reviewed in assessment of this patient  Antimicrobials:   DVT prophylaxis:    Code Status:   Code Status: DNR  Mobility Assessment (Last 72 Hours)     Mobility Assessment     Row Name 06/01/23 0745 05/31/23 2023 05/30/23 2145 05/29/23 1955     Does patient have an order for bedrest or  is patient medically unstable No - Continue assessment No - Continue assessment No - Continue assessment No - Continue assessment    What is the highest level of mobility based on the progressive mobility assessment? Level 1 (Bedfast) - Unable to balance while sitting on edge of bed Level 2 (Chairfast) - Balance while sitting on edge of bed and cannot stand Level 2 (Chairfast) - Balance while sitting on edge of bed and cannot stand Level 1 (Bedfast) - Unable to balance while sitting on edge of bed    Is the above level different from baseline mobility prior to current illness? No - Consider discontinuing PT/OT Yes - Recommend PT order Yes - Recommend PT order Yes - Recommend PT order            Mobility Assessment (Last 72 Hours)     Mobility Assessment     Row Name 06/01/23 0745 05/31/23 2023 05/30/23 2145 05/29/23 1955     Does patient have an order for bedrest or is patient medically unstable No - Continue assessment No - Continue assessment No - Continue assessment No - Continue assessment    What is the highest level of mobility based on the progressive mobility assessment? Level 1 (Bedfast) - Unable to balance while sitting on edge of bed Level 2 (Chairfast) - Balance while sitting on edge of bed and cannot stand Level 2 (Chairfast) - Balance while sitting on edge of bed and cannot stand Level 1 (Bedfast) - Unable to balance while sitting on edge of bed    Is the above level different from baseline mobility prior to current illness? No - Consider discontinuing PT/OT Yes - Recommend PT order Yes - Recommend PT order Yes - Recommend PT order            Mobility Assessment (Last 72 Hours)     Mobility Assessment     Row Name 06/01/23 0745 05/31/23 2023 05/30/23 2145 05/29/23 1955     Does patient have an order for bedrest or is patient medically unstable No - Continue assessment No - Continue assessment No - Continue assessment No - Continue assessment    What is the highest level  of mobility based on the progressive mobility assessment? Level 1 (Bedfast) - Unable to balance while sitting on edge of bed Level 2 (Chairfast) - Balance while sitting on edge of bed and cannot stand Level 2 (Chairfast) - Balance while sitting on edge of bed and cannot stand Level 1 (Bedfast) - Unable to balance while sitting on edge of bed    Is the above level different from baseline mobility prior to current illness? No - Consider discontinuing PT/OT Yes - Recommend PT order Yes - Recommend PT order Yes - Recommend PT order             Barriers to discharge: none Disposition Plan:  TBD Status is: Inpt  Objective: Blood pressure 125/63, pulse 77, temperature 98.4 F (36.9 C), temperature source Axillary, resp. rate 18, height 5\' 8"  (1.727 m), weight 73.1 kg, SpO2 100%.  Examination:  Physical Exam Constitutional:      Comments: Chronically ill-appearing  elderly gentleman lying in bed in no distress  HENT:     Head: Normocephalic and atraumatic.     Mouth/Throat:     Mouth: Mucous membranes are moist.  Eyes:     Pupils: Pupils are equal, round, and reactive to light.  Cardiovascular:     Rate and Rhythm: Normal rate. Rhythm irregular.  Pulmonary:     Effort: Pulmonary effort is normal. No respiratory distress.     Breath sounds: Normal breath sounds. No wheezing.  Abdominal:     General: Bowel sounds are normal. There is no distension.     Palpations: Abdomen is soft.     Tenderness: There is no abdominal tenderness.  Musculoskeletal:        General: Normal range of motion.     Cervical back: Normal range of motion and neck supple.  Skin:    General: Skin is warm and dry.  Neurological:     Comments: Moves all 4 extremities and follows commands      Consultants:  Oncology   Procedures:    Data Reviewed: No results found for this or any previous visit (from the past 24 hour(s)).   I have reviewed pertinent nursing notes, vitals, labs, and images as necessary. I  have ordered labwork to follow up on as indicated.  I have reviewed the last notes from staff over past 24 hours. I have discussed patient's care plan and test results with nursing staff, CM/SW, and other staff as appropriate.  Time spent: Greater than 50% of the 55 minute visit was spent in counseling/coordination of care for the patient as laid out in the A&P.   LOS: 5 days   Lewie Chamber, MD [facility] 06/01/2023, 4:38 PM

## 2023-06-01 NOTE — Plan of Care (Signed)
  Problem: Health Behavior/Discharge Planning: Goal: Ability to manage health-related needs will improve Outcome: Progressing   Problem: Elimination: Goal: Will not experience complications related to bowel motility Outcome: Progressing Goal: Will not experience complications related to urinary retention Outcome: Progressing   Problem: Pain Managment: Goal: General experience of comfort will improve Outcome: Progressing   Problem: Safety: Goal: Ability to remain free from injury will improve Outcome: Progressing

## 2023-06-02 DIAGNOSIS — C787 Secondary malignant neoplasm of liver and intrahepatic bile duct: Secondary | ICD-10-CM | POA: Diagnosis not present

## 2023-06-02 DIAGNOSIS — C3411 Malignant neoplasm of upper lobe, right bronchus or lung: Secondary | ICD-10-CM | POA: Diagnosis not present

## 2023-06-02 NOTE — Progress Notes (Signed)
   Spoke to [place] today to confirm interest in hospice services. They are in agreement Was able to speak with her and the pt's son Frutoso Chase.  They are requesting a w/c, oxygen and shower chair to be delivered to the home prior to pt d/c. She was offered a bed and refused it at this time. They already have some equipment of walker and BSC at home.   We discussed transport when pt is ready for d/c. [name] states that he brothers will help get him home in the [place].    I have ordered requested equipment for delivery and also ordered oxygen tank to be delivered to the pt's room to go home by car.   Norm Parcel RN [phone]

## 2023-06-02 NOTE — TOC Progression Note (Addendum)
Transition of Care [facility]) - Progression Note    Patient Details  Name: Gregory Butler MRN: [id number] Date of Birth: 07-09-1932  Transition of Care [facility] CM/SW Contact  Princella Ion, LCSW Phone Number: 06/02/2023, 12:03 PM  Clinical Narrative:    CSW spoke with Norm Parcel at [facility] [place]. Cheri confirmed that pt was accepted for Hospice Services in the home. She is attempting to contact family to confirm DME needs. She will inform CSW once DME/orders are confirmed with family to hopefully get pt home and assessed by Hospice team tomorrow. TOC following.  Addend @ 2:05 PM Notified by Cheri that pt's daughter declined Hospital Bed but requested o2, wc, and shower chair. Daughter reported they have walker and BSC. Awaiting response from MD regarding medical stability for d/c.  Addend @ 3:34 PM Cheri informed she'd placed orders for wc, o2, and shower chair. Cheri to confirm with [facility] regarding request for wc or rollator. Daughter reportedly told Cheri they have a walker at home but is now requesting rollator when this CSW spoke with her at bedside. Family would like equipment to be in place before pt discharges home. MD aware and requests TOC updates him on DME delivery. Cheri informed portable o2 to be delivered to pt prior to d/c for use, if needed. TOC following for additional needs.   Expected Discharge Plan: Home w Hospice Care Barriers to Discharge: Continued Medical Work up  Expected Discharge Plan and Services In-house Referral: Clinical Social Work     Living arrangements for the past 2 months: Single Family Home                                       Social Determinants of Health (SDOH) Interventions SDOH Screenings   Food Insecurity: No Food Insecurity (05/27/2023)  Housing: Low Risk  (05/27/2023)  Transportation Needs: No Transportation Needs (05/27/2023)  Utilities: Not At Risk (05/27/2023)  Tobacco Use: Medium Risk (05/27/2023)    Readmission  Risk Interventions     No data to display

## 2023-06-02 NOTE — Progress Notes (Signed)
Mobility Specialist - Progress Note   06/02/23 1145  Mobility  Activity Transferred from bed to chair  Level of Assistance Minimal assist, patient does 75% or more  Distance Ambulated (ft) 2 ft  Range of Motion/Exercises Active Assistive  Activity Response Tolerated well  Mobility Referral Yes  $Mobility charge 1 Mobility  Mobility Specialist Start Time (ACUTE ONLY) 1140  Mobility Specialist Stop Time (ACUTE ONLY) 1145  Mobility Specialist Time Calculation (min) (ACUTE ONLY) 5 min   RN requesting assistance to get pt to recliner chair. Pt was min-A for bed mobility to sit EOB and able to stand and take a couple of steps to pivot to recliner chair. Was a +2 for safety and able to follow cues during transfer. Was left on recliner chair with RN in room.  Billey Chang Mobility Specialist

## 2023-06-02 NOTE — Plan of Care (Signed)
  Problem: Health Behavior/Discharge Planning: Goal: Ability to manage health-related needs will improve Outcome: Progressing   Problem: Clinical Measurements: Goal: Will remain free from infection Outcome: Progressing Goal: Respiratory complications will improve Outcome: Progressing   Problem: Activity: Goal: Risk for activity intolerance will decrease Outcome: Progressing   Problem: Coping: Goal: Level of anxiety will decrease Outcome: Progressing   Problem: Safety: Goal: Ability to remain free from injury will improve Outcome: Progressing

## 2023-06-02 NOTE — Progress Notes (Signed)
Progress Note    Gregory Butler   YQM:[id number]  DOB: 1932-08-30  DOA: 05/26/2023     6 PCP: Chauncy Lean, PA-C  Initial CC: AMS  Hospital Course: Gregory Butler is an 89 yo male with PMH afib, HTN, HLD.  Over the past month prior to hospitalization patient has become progressively disoriented and functionally declining.  He has been having decreased appetite and worsened confusion. On workup he was found to have hypercalcemia, hypotension, and worsened encephalopathy.  He was started on fluids, Zometa, and underwent further workup. Imaging studies showed a cavitary mass involving the right upper lobe measuring 4.5 x 3.3 cm.  There was also suspected liver metastasis involving left hepatic lobe measuring 5.6 x 6.5 cm.  Interval History:  No acute events overnight. Family has spoken with oncology and understands hospice rec's and he is not a treatment candidate. Currently, plan is home with hospice once DME can be arranged.   Assessment and Plan:  RUL mass Liver metastasis  -CT shows cavitary mass in RUL measuring 4.5 x 3.3 cm and left hepatic lobe mass measuring 5.6 x 6.5 cm - Presumed diagnosis is primary lung cancer with liver metastasis - Liver biopsy performed on 05/31/2023.  Will take several days for results - multiple GOC discussions; and family has met with oncology on 7/19 also; patient not a treatment candidate and again was recommended for transition to hospice per oncology.  Family now amenable for this and hospice has been consulted.  Plan will be for discharging home with hospice once DME arranged  Goals of Care - Overall his prognosis remains poor - s/p biopsy 7/18 which may help answer question of diagnosis to allow family acceptance and pursuit of hospice; he would be an extremely poor treatment candidate and family understands this (family now more comfortable and amenable to hospice s/p oncology conversation) - home with hospice once DME  arranged/delivered  Hypercalcemia - resolved  - Suspected due to underlying malignancy - Follow-up PTH-rp - S/p zoledronic acid and fluids  Acute Metabolic Encephalopathy Delirium - continue haldol and ativan PRN -Continue delirium precautions - Hypercalcemia has been treated   Dysphagia - continue CLD; will try for Dys 1 diet if able too    Hypotension  Lactic Acidosis Suspect related to hypovolemia with poor PO intake and hypercalcemia - did not tolerate dilt gtt    Hypomagnesemia  Hypophosphatemia Hypokalemia - repleted  Atrial Fibrillation with RVR Echo -> EF 70-75%, no RWMA, severely elevated PASP, moderate to severe AVS, dilated IVC with <50% resp variability Hold anticoagulation for now - stop dilt gtt due to bradycardia and  hypoTN - IV lopressor PRN  AKI - baseline creatinine ~ 1 - patient presents with increase in creat >0.3 mg/dL above baseline or creat increase >1.5x baseline presumed to have occurred within past 7 days PTA - creat 1.51 on admission; resolved with IVF   Prediabtetes - diet control    Dyslipidemia - no further mortality benefit with statin use    Intermediate Density lesions off R Kidney Hemorrhagic or proteinaceous cysts?   Old records reviewed in assessment of this patient  Antimicrobials:   DVT prophylaxis:    Code Status:   Code Status: DNR  Mobility Assessment (Last 72 Hours)     Mobility Assessment     Row Name 06/01/23 2053 06/01/23 0745 05/31/23 2023 05/30/23 2145     Does patient have an order for bedrest or is patient medically unstable No - Continue assessment No - Continue  assessment No - Continue assessment No - Continue assessment    What is the highest level of mobility based on the progressive mobility assessment? Level 2 (Chairfast) - Balance while sitting on edge of bed and cannot stand Level 1 (Bedfast) - Unable to balance while sitting on edge of bed Level 2 (Chairfast) - Balance while sitting on edge of  bed and cannot stand Level 2 (Chairfast) - Balance while sitting on edge of bed and cannot stand    Is the above level different from baseline mobility prior to current illness? Yes - Recommend PT order No - Consider discontinuing PT/OT Yes - Recommend PT order Yes - Recommend PT order            Mobility Assessment (Last 72 Hours)     Mobility Assessment     Row Name 06/01/23 2053 06/01/23 0745 05/31/23 2023 05/30/23 2145     Does patient have an order for bedrest or is patient medically unstable No - Continue assessment No - Continue assessment No - Continue assessment No - Continue assessment    What is the highest level of mobility based on the progressive mobility assessment? Level 2 (Chairfast) - Balance while sitting on edge of bed and cannot stand Level 1 (Bedfast) - Unable to balance while sitting on edge of bed Level 2 (Chairfast) - Balance while sitting on edge of bed and cannot stand Level 2 (Chairfast) - Balance while sitting on edge of bed and cannot stand    Is the above level different from baseline mobility prior to current illness? Yes - Recommend PT order No - Consider discontinuing PT/OT Yes - Recommend PT order Yes - Recommend PT order            Mobility Assessment (Last 72 Hours)     Mobility Assessment     Row Name 06/01/23 2053 06/01/23 0745 05/31/23 2023 05/30/23 2145     Does patient have an order for bedrest or is patient medically unstable No - Continue assessment No - Continue assessment No - Continue assessment No - Continue assessment    What is the highest level of mobility based on the progressive mobility assessment? Level 2 (Chairfast) - Balance while sitting on edge of bed and cannot stand Level 1 (Bedfast) - Unable to balance while sitting on edge of bed Level 2 (Chairfast) - Balance while sitting on edge of bed and cannot stand Level 2 (Chairfast) - Balance while sitting on edge of bed and cannot stand    Is the above level different from baseline  mobility prior to current illness? Yes - Recommend PT order No - Consider discontinuing PT/OT Yes - Recommend PT order Yes - Recommend PT order            Mobility Assessment (Last 72 Hours)     Mobility Assessment     Row Name 06/01/23 2053 06/01/23 0745 05/31/23 2023 05/30/23 2145     Does patient have an order for bedrest or is patient medically unstable No - Continue assessment No - Continue assessment No - Continue assessment No - Continue assessment    What is the highest level of mobility based on the progressive mobility assessment? Level 2 (Chairfast) - Balance while sitting on edge of bed and cannot stand Level 1 (Bedfast) - Unable to balance while sitting on edge of bed Level 2 (Chairfast) - Balance while sitting on edge of bed and cannot stand Level 2 (Chairfast) - Balance while sitting on edge of  bed and cannot stand    Is the above level different from baseline mobility prior to current illness? Yes - Recommend PT order No - Consider discontinuing PT/OT Yes - Recommend PT order Yes - Recommend PT order             Barriers to discharge: none Disposition Plan:  Home with hospice  Status is: Inpt  Objective: Blood pressure 125/76, pulse 62, temperature (!) 97.5 F (36.4 C), temperature source Oral, resp. rate 18, height 5\' 8"  (1.727 m), weight 73.1 kg, SpO2 99%.  Examination:  Physical Exam Constitutional:      Comments: Chronically ill-appearing elderly gentleman lying in bed in no distress  HENT:     Head: Normocephalic and atraumatic.     Mouth/Throat:     Mouth: Mucous membranes are moist.  Eyes:     Pupils: Pupils are equal, round, and reactive to light.  Cardiovascular:     Rate and Rhythm: Normal rate. Rhythm irregular.  Pulmonary:     Effort: Pulmonary effort is normal. No respiratory distress.     Breath sounds: Normal breath sounds. No wheezing.  Abdominal:     General: Bowel sounds are normal. There is no distension.     Palpations: Abdomen is  soft.     Tenderness: There is no abdominal tenderness.  Musculoskeletal:        General: Normal range of motion.     Cervical back: Normal range of motion and neck supple.  Skin:    General: Skin is warm and dry.  Neurological:     Comments: Moves all 4 extremities and follows commands      Consultants:  Oncology   Procedures:    Data Reviewed: No results found for this or any previous visit (from the past 24 hour(s)).   I have reviewed pertinent nursing notes, vitals, labs, and images as necessary. I have ordered labwork to follow up on as indicated.  I have reviewed the last notes from staff over past 24 hours. I have discussed patient's care plan and test results with nursing staff, CM/SW, and other staff as appropriate.    LOS: 6 days   Lewie Chamber, MD [facility] 06/02/2023, 3:41 PM

## 2023-06-03 DIAGNOSIS — I4891 Unspecified atrial fibrillation: Secondary | ICD-10-CM | POA: Diagnosis not present

## 2023-06-03 DIAGNOSIS — C3411 Malignant neoplasm of upper lobe, right bronchus or lung: Secondary | ICD-10-CM | POA: Diagnosis not present

## 2023-06-03 DIAGNOSIS — C787 Secondary malignant neoplasm of liver and intrahepatic bile duct: Secondary | ICD-10-CM | POA: Diagnosis not present

## 2023-06-03 LAB — PTH-RELATED PEPTIDE: PTH-related peptide: <2 pmol/L

## 2023-06-03 MED ORDER — LORAZEPAM 2 MG/ML PO CONC: 2.0000 mg | ORAL | 0 refills | Status: AC | PRN

## 2023-06-03 NOTE — TOC Transition Note (Signed)
Transition of Care [facility]) - CM/SW Discharge Note   Patient Details  Name: Gregory Butler MRN: [id number] Date of Birth: 06-Jan-1932  [facility]) CM/SW Contact:  Otelia Santee, LCSW Phone Number: 06/03/2023, 3:01 PM   Clinical Narrative:    Pt able to transfer home with hospice services through [facility]. Spoke with family and answered questions regarding equipment and ongoing supplies. Pt agreeable to pt returning home. PT[facility] called at 3:00pm for  transportation.    Final next level of care: Home w Hospice Care Barriers to Discharge: Barriers Resolved   Patient Goals and CMS Choice CMS Medicare.gov Compare Post Acute Care list provided to:: Patient Represenative (must comment) Choice offered to / list presented to : Adult Children  Discharge Placement                  Patient to be transferred to facility by: PTAR Name of family member notified: Children Patient and family notified of of transfer: 06/03/23  Discharge Plan and Services Additional resources added to the After Visit Summary for   In-house Referral: Clinical Social Work              DME Arranged: Oxygen, Shower stool, Government social research officer DME Agency: [facility]                  Social Determinants of Health (SDOH) Interventions SDOH Screenings   Food Insecurity: No Food Insecurity (05/27/2023)  Housing: Low Risk  (05/27/2023)  Transportation Needs: No Transportation Needs (05/27/2023)  Utilities: Not At Risk (05/27/2023)  Tobacco Use: Medium Risk (05/27/2023)     Readmission Risk Interventions    06/03/2023    2:09 PM  Readmission Risk Prevention Plan  Transportation Screening Complete  PCP or Specialist Appt within 5-7 Days Complete  Home Care Screening Complete  Medication Review (RN CM) Complete

## 2023-06-03 NOTE — TOC Progression Note (Signed)
Transition of Care [facility]) - Progression Note    Patient Details  Name: Gregory Butler MRN: [id number] Date of Birth: 24-Jan-1932  [facility]) CM/SW Contact  Otelia Santee, LCSW Phone Number: 06/03/2023, 2:09 PM  Clinical Narrative:    Per Lanora Manis w/ [facility] DME has been delivered to pt's home.   Expected Discharge Plan: Home w Hospice Care Barriers to Discharge: Continued Medical Work up  Expected Discharge Plan and Services In-house Referral: Clinical Social Work     Living arrangements for the past 2 months: Single Family Home                                       Social Determinants of Health (SDOH) Interventions SDOH Screenings   Food Insecurity: No Food Insecurity (05/27/2023)  Housing: Low Risk  (05/27/2023)  Transportation Needs: No Transportation Needs (05/27/2023)  Utilities: Not At Risk (05/27/2023)  Tobacco Use: Medium Risk (05/27/2023)    Readmission Risk Interventions    06/03/2023    2:09 PM  Readmission Risk Prevention Plan  Transportation Screening Complete  PCP or Specialist Appt within 5-7 Days Complete  Home Care Screening Complete  Medication Review (RN CM) Complete

## 2023-06-03 NOTE — Discharge Summary (Signed)
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 Evaluation with CT of the chest may be considered. Electronically Signed   By: Ted Mcalpine M.D.   On: 05/26/2023 18:37     Time coordinating discharge: Over 30 minutes    Lewie Chamber, MD  [facility] 06/03/2023, 3:13 PM

## 2023-06-05 LAB — SURGICAL PATHOLOGY
